# Patient Record
Sex: Male | Born: 1960 | Race: White | Hispanic: No | Marital: Married | Smoking: Never smoker
Health system: Southern US, Community
[De-identification: ages and names within clinical notes are randomized; demographics above are authoritative.]

## PROBLEM LIST (undated history)

## (undated) DIAGNOSIS — E119 Type 2 diabetes mellitus without complications: Secondary | ICD-10-CM

## (undated) HISTORY — PX: APPENDECTOMY: SHX54

---

## 2018-06-02 ENCOUNTER — Emergency Department
Admission: EM | Admit: 2018-06-02 | Discharge: 2018-06-02 | Disposition: A | Discharge status: Home / Self Care | Payer: 59 | Attending: Emergency Medicine | Admitting: Emergency Medicine

## 2018-06-02 ENCOUNTER — Other Ambulatory Visit: Payer: Self-pay

## 2018-06-02 ENCOUNTER — Emergency Department: Payer: 59

## 2018-06-02 ENCOUNTER — Encounter: Payer: Self-pay | Admitting: Emergency Medicine

## 2018-06-02 DIAGNOSIS — J129 Viral pneumonia, unspecified: Secondary | ICD-10-CM

## 2018-06-02 DIAGNOSIS — J1289 Other viral pneumonia: Secondary | ICD-10-CM | POA: Insufficient documentation

## 2018-06-02 DIAGNOSIS — E119 Type 2 diabetes mellitus without complications: Secondary | ICD-10-CM | POA: Insufficient documentation

## 2018-06-02 DIAGNOSIS — R0602 Shortness of breath: Secondary | ICD-10-CM | POA: Diagnosis present

## 2018-06-02 HISTORY — DX: Type 2 diabetes mellitus without complications: E11.9

## 2018-06-02 LAB — CBC WITH DIFFERENTIAL/PLATELET
Abs Immature Granulocytes: 0 10*3/uL (ref 0.00–0.07)
Basophils Absolute: 0 10*3/uL (ref 0.0–0.1)
Basophils Relative: 0 %
Eosinophils Absolute: 0 10*3/uL (ref 0.0–0.5)
Eosinophils Relative: 0 %
HCT: 43.6 % (ref 39.0–52.0)
Hemoglobin: 15.1 g/dL (ref 13.0–17.0)
Immature Granulocytes: 0 %
Lymphocytes Relative: 25 %
Lymphs Abs: 1.3 10*3/uL (ref 0.7–4.0)
MCH: 31.7 pg (ref 26.0–34.0)
MCHC: 34.6 g/dL (ref 30.0–36.0)
MCV: 91.4 fL (ref 80.0–100.0)
Monocytes Absolute: 0.6 10*3/uL (ref 0.1–1.0)
Monocytes Relative: 11 %
Neutro Abs: 3.3 10*3/uL (ref 1.7–7.7)
Neutrophils Relative %: 64 %
Platelets: 117 10*3/uL — ABNORMAL LOW (ref 150–400)
RBC: 4.77 MIL/uL (ref 4.22–5.81)
RDW: 12.2 % (ref 11.5–15.5)
WBC: 5.3 10*3/uL (ref 4.0–10.5)
nRBC: 0 % (ref 0.0–0.2)

## 2018-06-02 LAB — BASIC METABOLIC PANEL
Anion gap: 13 (ref 5–15)
BUN: 14 mg/dL (ref 6–20)
CO2: 22 mmol/L (ref 22–32)
Calcium: 8.5 mg/dL — ABNORMAL LOW (ref 8.9–10.3)
Chloride: 98 mmol/L (ref 98–111)
Creatinine, Ser: 0.76 mg/dL (ref 0.61–1.24)
GFR calc Af Amer: >60 mL/min (ref >60)
GFR calc non Af Amer: >60 mL/min (ref >60)
Glucose, Bld: 138 mg/dL — ABNORMAL HIGH (ref 70–99)
Potassium: 3.7 mmol/L (ref 3.5–5.1)
Sodium: 133 mmol/L — ABNORMAL LOW (ref 135–145)

## 2018-06-02 LAB — LACTIC ACID, PLASMA: Lactic Acid, Venous: 1.4 mmol/L (ref 0.5–1.9)

## 2018-06-02 MED ORDER — SODIUM CHLORIDE 0.9 % IV BOLUS
1000.0000 mL | Freq: Once | INTRAVENOUS | Status: AC
  Administered 2018-06-02: 18:00:00 1000 mL via INTRAVENOUS

## 2018-06-02 MED ORDER — KETOROLAC TROMETHAMINE 30 MG/ML IJ SOLN
30.0000 mg | Freq: Once | INTRAMUSCULAR | Status: AC
  Administered 2018-06-02: 30 mg via INTRAVENOUS
  Filled 2018-06-02: qty 1

## 2018-06-02 MED ORDER — ACETAMINOPHEN 500 MG PO TABS
1000.0000 mg | ORAL_TABLET | Freq: Once | ORAL | Status: AC
  Administered 2018-06-02: 18:00:00 1000 mg via ORAL

## 2018-06-02 MED ORDER — ALBUTEROL SULFATE HFA 108 (90 BASE) MCG/ACT IN AERS: 2.0000 | INHALATION_SPRAY | RESPIRATORY_TRACT | 0 refills | Status: AC | PRN

## 2018-06-02 MED ORDER — ACETAMINOPHEN 500 MG PO TABS
ORAL_TABLET | ORAL | Status: AC
  Filled 2018-06-02: qty 2

## 2018-06-02 NOTE — ED Provider Notes (Signed)
[facility] Emergency Department Provider Note ____________________________________________   First MD Initiated Contact with Patient 06/02/18 1710     (approximate)  I have reviewed the triage vital signs and the nursing notes.   HISTORY  Chief Complaint Fever and Cough    HPI Adrian Mclean is a 58 y.o. male with PMH as noted below who presents with worsening shortness of breath over the last 6 days, gradual onset, associated with fever, body aches, generalized malaise, as well as with a headache.  In addition the patient reports some diarrhea and decreased appetite.  He denies any travel outside the state or any specific sick contacts but states that in his job he was in contact with multiple people from different states in the last few weeks.  He was seen at the outpatient clinic 4 days ago and had a negative flu test.  He was started on azithromycin, cefdinir, and Tamiflu but his symptoms have worsened.  Past Medical History:  Diagnosis Date  . Diabetes mellitus without complication (HCC)     There are no active problems to display for this patient.   Past Surgical History:  Procedure Laterality Date  . APPENDECTOMY      Prior to Admission medications   Medication Sig Start Date End Date Taking? Authorizing Provider  albuterol (PROVENTIL HFA;VENTOLIN HFA) 108 (90 Base) MCG/ACT inhaler Inhale 2 puffs into the lungs every 4 (four) hours as needed for shortness of breath. 06/02/18   Dionne Bucy, MD    Allergies Codeine  No family history on file.  Social History Social History   Tobacco Use  . Smoking status: Never Smoker  . Smokeless tobacco: Never Used  Substance Use Topics  . Alcohol use: Never    Frequency: Never  . Drug use: Never    Review of Systems  Constitutional: Positive for fever. Eyes: No redness. ENT: No nasal congestion. Cardiovascular: Denies chest pain. Respiratory: Positive for shortness of breath.  Gastrointestinal: No vomiting.  Positive for diarrhea.  Genitourinary: Negative for flank pain.  Musculoskeletal: Positive for myalgias. Skin: Negative for rash. Neurological: Positive for headache.   ____________________________________________   PHYSICAL EXAM:  VITAL SIGNS: ED Triage Vitals  Enc Vitals Group     BP 06/02/18 1648 (!) 143/67     Pulse Rate 06/02/18 1648 89     Resp 06/02/18 1648 16     Temp 06/02/18 1648 100.2 F (37.9 C)     Temp Source 06/02/18 1648 Oral     SpO2 06/02/18 1648 96 %     Weight 06/02/18 1651 220 lb (99.8 kg)     Height 06/02/18 1651 5\' 9"  (1.753 m)     Head Circumference --      Peak Flow --      Pain Score 06/02/18 1650 8     Pain Loc --      Pain Edu? --      Excl. in GC? --     Constitutional: Alert and oriented.  Slightly tired appearing but in no acute distress. Eyes: Conjunctivae are normal.  Head: Atraumatic. Nose: No congestion/rhinnorhea. Mouth/Throat: No stridor. Neck: Normal range of motion.  Cardiovascular: Normal rate, regular rhythm.  Good peripheral circulation. Respiratory: Normal respiratory effort.  No retractions.  Gastrointestinal: No distention.  Musculoskeletal: Extremities warm and well perfused.  Neurologic:  Normal speech and language. No gross focal neurologic deficits are appreciated.  Skin:  Skin is warm and dry. No rash noted. Psychiatric: Mood and affect are normal.  Speech and behavior are normal.  ____________________________________________   LABS (all labs ordered are listed, but only abnormal results are displayed)  Labs Reviewed  BASIC METABOLIC PANEL - Abnormal; Notable for the following components:      Result Value   Sodium 133 (*)    Glucose, Bld 138 (*)    Calcium 8.5 (*)    All other components within normal limits  CBC WITH DIFFERENTIAL/PLATELET - Abnormal; Notable for the following components:   Platelets 117 (*)    All other components within normal limits  NOVEL CORONAVIRUS,  NAA (HOSPITAL ORDER, SEND-OUT TO REF LAB)  LACTIC ACID, PLASMA   ____________________________________________  EKG   ____________________________________________  RADIOLOGY  CXR: Patchy bilateral hazy opacities  ____________________________________________   PROCEDURES  Procedure(s) performed: No  Procedures  Critical Care performed: No ____________________________________________   INITIAL IMPRESSION / ASSESSMENT AND PLAN / ED COURSE  Pertinent labs & imaging results that were available during my care of the patient were reviewed by me and considered in my medical decision making (see chart for details).  58 year old male with history of diabetes presents with worsening shortness of breath, fever, malaise, headache and body aches over the last 6 days.  I reviewed the past medical records in care everywhere; he was seen at the outpatient clinic on 05/29/2018 and had a negative flu swab.  Chest x-ray subsequently showed possible right-sided "fullness" although on that date the patient was started empirically on azithromycin, cefdinir, and Tamiflu.  On exam today the patient is somewhat tired but overall well-appearing.  He is febrile with otherwise normal vital signs.  O2 saturation is in the mid 90s on room air and he has no significant increased work of breathing or respiratory distress.  Overall I have a high suspicion for COVID-19, especially given that the patient has already been on antibiotics for several days and has worsened.  Differential would also include refractory bacterial pneumonia, or other viral etiology.  Although the patient will not necessarily require admission, given the nature of his symptoms, the otherwise negative work-up, and the high risk for COVID-19 I will order the test.  We will obtain a chest x-ray, lab work-up, and reassess.  I will give fluids as well as Toradol for symptomatic treatment.  ----------------------------------------- 7:49 PM on  06/02/2018 -----------------------------------------  The patient is feeling much better and states his headache has resolved.  Chest x-ray shows faint hazy bilateral opacities.  The lab work-up is reassuring and the lactate is negative.    At this time the patient's O2 saturation remains in the mid 90s on room air and he has no increased work of breathing.  He does not require hospitalization at this time, and he feels comfortable going home.  I had an extensive discussion with the patient about the results of his work-up and the plan of care.  The hazy opacities on his chest x-ray, as well as the reassuring lab results are more consistent with a viral pneumonia or bronchitis rather than a bacterial pneumonia that is failing outpatient treatment.  I instructed him to continue the cefdinir and finish the full course.  He is already done with the azithromycin.  At this time, I do not think there is any benefit for him to continue the Tamiflu and it may be contributing to his GI symptoms and malaise.  I will give a prescription for albuterol to help with his bronchospasm.  I had a thorough discussion with him about return precautions as well as  self isolation guidelines, and he expressed understanding.  CDC PUI form was completed.   ______  Oneida Alar was evaluated in Emergency Department on 06/02/2018 for the symptoms described in the history of present illness. He was evaluated in the context of the global COVID-19 pandemic, which necessitated consideration that the patient might be at risk for infection with the SARS-CoV-2 virus that causes COVID-19. Institutional protocols and algorithms that pertain to the evaluation of patients at risk for COVID-19 are in a state of rapid change based on information released by regulatory bodies including the CDC and federal and state organizations. These policies and algorithms were followed during the patient's care in the ED.   ____________________________________________   FINAL CLINICAL IMPRESSION(S) / ED DIAGNOSES  Final diagnoses:  Viral pneumonia      NEW MEDICATIONS STARTED DURING THIS VISIT:  New Prescriptions   ALBUTEROL (PROVENTIL HFA;VENTOLIN HFA) 108 (90 BASE) MCG/ACT INHALER    Inhale 2 puffs into the lungs every 4 (four) hours as needed for shortness of breath.     Note:  This document was prepared using Dragon voice recognition software and may include unintentional dictation errors.    Dionne Bucy, MD 06/02/18 [phone]

## 2018-06-02 NOTE — Discharge Instructions (Addendum)
Take the albuterol as needed for shortness of breath or chest tightness every 4-6 hours.  You can take Tylenol or ibuprofen for fever, headache, or body aches.  Make sure to stay well-hydrated.  Continue the cefdinir and finish the full course.  You can discontinue the Tamiflu.  Self-isolation guidelines are provided below, as you are currently pending testing for possible COVID-19.  Return to the ER immediately for new or worsening shortness of breath, shortness of breath with minimal activity or exertion, worsening weakness, vomiting or inability to hold anything down, or any other new or worsening symptoms that concern you.      Person Under Monitoring Name: Adrian Mclean  Location: [address] [place] [address]   Infection Prevention Recommendations for Individuals Confirmed to have, or Being Evaluated for, 2019 Novel Coronavirus (COVID-19) Infection Who Receive Care at Home  Individuals who are confirmed to have, or are being evaluated for, COVID-19 should follow the prevention steps below until a healthcare provider or local or state health department says they can return to normal activities.  Stay home except to get medical care You should restrict activities outside your home, except for getting medical care. Do not go to work, school, or public areas, and do not use public transportation or taxis.  Call ahead before visiting your doctor Before your medical appointment, call the healthcare provider and tell them that you have, or are being evaluated for, COVID-19 infection. This will help the healthcare providers office take steps to keep other people from getting infected. Ask your healthcare provider to call the local or state health department.  Monitor your symptoms Seek prompt medical attention if your illness is worsening (e.g., difficulty breathing). Before going to your medical appointment, call the healthcare provider and tell them that you have, or are being  evaluated for, COVID-19 infection. Ask your healthcare provider to call the local or state health department.  Wear a facemask You should wear a facemask that covers your nose and mouth when you are in the same room with other people and when you visit a healthcare provider. People who live with or visit you should also wear a facemask while they are in the same room with you.  Separate yourself from other people in your home As much as possible, you should stay in a different room from other people in your home. Also, you should use a separate bathroom, if available.  Avoid sharing household items You should not share dishes, drinking glasses, cups, eating utensils, towels, bedding, or other items with other people in your home. After using these items, you should wash them thoroughly with soap and water.  Cover your coughs and sneezes Cover your mouth and nose with a tissue when you cough or sneeze, or you can cough or sneeze into your sleeve. Throw used tissues in a lined trash can, and immediately wash your hands with soap and water for at least 20 seconds or use an alcohol-based hand rub.  Wash your [employer] your hands often and thoroughly with soap and water for at least 20 seconds. You can use an alcohol-based hand sanitizer if soap and water are not available and if your hands are not visibly dirty. Avoid touching your eyes, nose, and mouth with unwashed hands.   Prevention Steps for Caregivers and Household Members of Individuals Confirmed to have, or Being Evaluated for, COVID-19 Infection Being Cared for in the Home  If you live with, or provide care at home for, a  person confirmed to have, or being evaluated for, COVID-19 infection please follow these guidelines to prevent infection:  Follow healthcare providers instructions Make sure that you understand and can help the patient follow any healthcare provider instructions for all care.  Provide for the patients  basic needs You should help the patient with basic needs in the home and provide support for getting groceries, prescriptions, and other personal needs.  Monitor the patients symptoms If they are getting sicker, call his or her medical provider and tell them that the patient has, or is being evaluated for, COVID-19 infection. This will help the healthcare providers office take steps to keep other people from getting infected. Ask the healthcare provider to call the local or state health department.  Limit the number of people who have contact with the patient If possible, have only one caregiver for the patient. Other household members should stay in another home or place of residence. If this is not possible, they should stay in another room, or be separated from the patient as much as possible. Use a separate bathroom, if available. Restrict visitors who do not have an essential need to be in the home.  Keep older adults, very young children, and other sick people away from the patient Keep older adults, very young children, and those who have compromised immune systems or chronic health conditions away from the patient. This includes people with chronic heart, lung, or kidney conditions, diabetes, and cancer.  Ensure good ventilation Make sure that shared spaces in the home have good air flow, such as from an air conditioner or an opened window, weather permitting.  Wash your hands often Wash your hands often and thoroughly with soap and water for at least 20 seconds. You can use an alcohol based hand sanitizer if soap and water are not available and if your hands are not visibly dirty. Avoid touching your eyes, nose, and mouth with unwashed hands. Use disposable paper towels to dry your hands. If not available, use dedicated cloth towels and replace them when they become wet.  Wear a facemask and gloves Wear a disposable facemask at all times in the room and gloves when you touch or  have contact with the patients blood, body fluids, and/or secretions or excretions, such as sweat, saliva, sputum, nasal mucus, vomit, urine, or feces.  Ensure the mask fits over your nose and mouth tightly, and do not touch it during use. Throw out disposable facemasks and gloves after using them. Do not reuse. Wash your hands immediately after removing your facemask and gloves. If your personal clothing becomes contaminated, carefully remove clothing and launder. Wash your hands after handling contaminated clothing. Place all used disposable facemasks, gloves, and other waste in a lined container before disposing them with other household waste. Remove gloves and wash your hands immediately after handling these items.  Do not share dishes, glasses, or other household items with the patient Avoid sharing household items. You should not share dishes, drinking glasses, cups, eating utensils, towels, bedding, or other items with a patient who is confirmed to have, or being evaluated for, COVID-19 infection. After the person uses these items, you should wash them thoroughly with soap and water.  Wash laundry thoroughly Immediately remove and wash clothes or bedding that have blood, body fluids, and/or secretions or excretions, such as sweat, saliva, sputum, nasal mucus, vomit, urine, or feces, on them. Wear gloves when handling laundry from the patient. Read and follow directions on labels of laundry or  clothing items and detergent. In general, wash and dry with the warmest temperatures recommended on the label.  Clean all areas the individual has used often Clean all touchable surfaces, such as counters, tabletops, doorknobs, bathroom fixtures, toilets, phones, keyboards, tablets, and bedside tables, every day. Also, clean any surfaces that may have blood, body fluids, and/or secretions or excretions on them. Wear gloves when cleaning surfaces the patient has come in contact with. Use a diluted  bleach solution (e.g., dilute bleach with 1 part bleach and 10 parts water) or a household disinfectant with a label that says EPA-registered for coronaviruses. To make a bleach solution at home, add 1 tablespoon of bleach to 1 quart (4 cups) of water. For a larger supply, add  cup of bleach to 1 gallon (16 cups) of water. Read labels of cleaning products and follow recommendations provided on product labels. Labels contain instructions for safe and effective use of the cleaning product including precautions you should take when applying the product, such as wearing gloves or eye protection and making sure you have good ventilation during use of the product. Remove gloves and wash hands immediately after cleaning.  Monitor yourself for signs and symptoms of illness Caregivers and household members are considered close contacts, should monitor their health, and will be asked to limit movement outside of the home to the extent possible. Follow the monitoring steps for close contacts listed on the symptom monitoring form.   ? If you have additional questions, contact your local health department or call the epidemiologist on call at [phone] (available 24/7). ? This guidance is subject to change. For the most up-to-date guidance from [facility], please refer to their website: [email or web address]

## 2018-06-02 NOTE — ED Triage Notes (Signed)
Patient states he started having body aches last Saturday evening along with shortness of breath.  Patient states he was seen at [facility] on Monday and was tested for Flu (negative), and had a chest x-ray (right lobe showing cloudy).  Patient was put on tamiflu, cefdinir and azithromycin.  Patient states he has had a constant headache and he has burning with taking a deep breath.  Patient states he feels like he can't breathe if he talks a lot.  Patient speaking in full sentences at this time.  Patient ambulatory to tent.  Patient reports burning sensation in chest with coughing.

## 2018-06-03 ENCOUNTER — Telehealth: Payer: Self-pay | Admitting: Internal Medicine

## 2018-06-03 NOTE — Telephone Encounter (Signed)
Left VM. Will call him back in the am to review results and public health recommendations

## 2018-06-05 LAB — NOVEL CORONAVIRUS, NAA (HOSP ORDER, SEND-OUT TO REF LAB; TAT 18-24 HRS): SARS-CoV-2, NAA: DETECTED — AB

## 2018-06-06 ENCOUNTER — Encounter: Payer: Self-pay | Admitting: Emergency Medicine

## 2018-06-06 ENCOUNTER — Emergency Department: Payer: 59

## 2018-06-06 ENCOUNTER — Other Ambulatory Visit: Payer: Self-pay

## 2018-06-06 ENCOUNTER — Emergency Department
Admission: EM | Admit: 2018-06-06 | Discharge: 2018-06-07 | Disposition: A | Discharge status: Home / Self Care | Payer: 59 | Attending: Internal Medicine | Admitting: Internal Medicine

## 2018-06-06 DIAGNOSIS — E119 Type 2 diabetes mellitus without complications: Secondary | ICD-10-CM | POA: Diagnosis not present

## 2018-06-06 DIAGNOSIS — R6889 Other general symptoms and signs: Secondary | ICD-10-CM | POA: Diagnosis not present

## 2018-06-06 DIAGNOSIS — J069 Acute upper respiratory infection, unspecified: Secondary | ICD-10-CM | POA: Diagnosis present

## 2018-06-06 DIAGNOSIS — R0602 Shortness of breath: Secondary | ICD-10-CM | POA: Diagnosis present

## 2018-06-06 DIAGNOSIS — R0902 Hypoxemia: Secondary | ICD-10-CM | POA: Diagnosis not present

## 2018-06-06 DIAGNOSIS — J988 Other specified respiratory disorders: Secondary | ICD-10-CM

## 2018-06-06 LAB — PROCALCITONIN: Procalcitonin: <0.1 ng/mL

## 2018-06-06 LAB — CBC WITH DIFFERENTIAL/PLATELET
Abs Immature Granulocytes: 0.02 10*3/uL (ref 0.00–0.07)
Basophils Absolute: 0 10*3/uL (ref 0.0–0.1)
Basophils Relative: 0 %
Eosinophils Absolute: 0 10*3/uL (ref 0.0–0.5)
Eosinophils Relative: 0 %
HCT: 42.4 % (ref 39.0–52.0)
Hemoglobin: 14.6 g/dL (ref 13.0–17.0)
Immature Granulocytes: 0 %
Lymphocytes Relative: 11 %
Lymphs Abs: 0.7 10*3/uL (ref 0.7–4.0)
MCH: 31.5 pg (ref 26.0–34.0)
MCHC: 34.4 g/dL (ref 30.0–36.0)
MCV: 91.4 fL (ref 80.0–100.0)
Monocytes Absolute: 0.4 10*3/uL (ref 0.1–1.0)
Monocytes Relative: 6 %
Neutro Abs: 5 10*3/uL (ref 1.7–7.7)
Neutrophils Relative %: 83 %
Platelets: 144 10*3/uL — ABNORMAL LOW (ref 150–400)
RBC: 4.64 MIL/uL (ref 4.22–5.81)
RDW: 12.4 % (ref 11.5–15.5)
WBC: 6.1 10*3/uL (ref 4.0–10.5)
nRBC: 0 % (ref 0.0–0.2)

## 2018-06-06 LAB — TROPONIN I: Troponin I: <0.03 ng/mL (ref <0.03)

## 2018-06-06 LAB — COMPREHENSIVE METABOLIC PANEL
ALT: 32 U/L (ref 0–44)
AST: 46 U/L — ABNORMAL HIGH (ref 15–41)
Albumin: 4.1 g/dL (ref 3.5–5.0)
Alkaline Phosphatase: 57 U/L (ref 38–126)
Anion gap: 15 (ref 5–15)
BUN: 16 mg/dL (ref 6–20)
CO2: 21 mmol/L — ABNORMAL LOW (ref 22–32)
Calcium: 8.4 mg/dL — ABNORMAL LOW (ref 8.9–10.3)
Chloride: 99 mmol/L (ref 98–111)
Creatinine, Ser: 0.75 mg/dL (ref 0.61–1.24)
GFR calc Af Amer: >60 mL/min (ref >60)
GFR calc non Af Amer: >60 mL/min (ref >60)
Glucose, Bld: 139 mg/dL — ABNORMAL HIGH (ref 70–99)
Potassium: 3.7 mmol/L (ref 3.5–5.1)
Sodium: 135 mmol/L (ref 135–145)
Total Bilirubin: 1.4 mg/dL — ABNORMAL HIGH (ref 0.3–1.2)
Total Protein: 7.8 g/dL (ref 6.5–8.1)

## 2018-06-06 LAB — BLOOD GAS, VENOUS
Acid-base deficit: 1.1 mmol/L (ref 0.0–2.0)
Bicarbonate: 22.6 mmol/L (ref 20.0–28.0)
O2 Saturation: 86.8 %
Patient temperature: 37
pCO2, Ven: 34 mmHg — ABNORMAL LOW (ref 44.0–60.0)
pH, Ven: 7.43 (ref 7.250–7.430)
pO2, Ven: 51 mmHg — ABNORMAL HIGH (ref 32.0–45.0)

## 2018-06-06 LAB — FERRITIN: Ferritin: 894 ng/mL — ABNORMAL HIGH (ref 24–336)

## 2018-06-06 LAB — GLUCOSE, CAPILLARY: Glucose-Capillary: 131 mg/dL — ABNORMAL HIGH (ref 70–99)

## 2018-06-06 LAB — CK: Total CK: 110 U/L (ref 49–397)

## 2018-06-06 LAB — FIBRIN DERIVATIVES D-DIMER (ARMC ONLY): Fibrin derivatives D-dimer (ARMC): 1985.26 ng/mL (FEU) — ABNORMAL HIGH (ref 0.00–499.00)

## 2018-06-06 LAB — LACTIC ACID, PLASMA: Lactic Acid, Venous: 1.1 mmol/L (ref 0.5–1.9)

## 2018-06-06 LAB — BRAIN NATRIURETIC PEPTIDE: B Natriuretic Peptide: 20 pg/mL (ref 0.0–100.0)

## 2018-06-06 LAB — LACTATE DEHYDROGENASE: LDH: 308 U/L — ABNORMAL HIGH (ref 98–192)

## 2018-06-06 MED ORDER — ACETAMINOPHEN 500 MG PO TABS
ORAL_TABLET | ORAL | Status: AC
  Filled 2018-06-06: qty 2

## 2018-06-06 MED ORDER — SODIUM CHLORIDE 0.9 % IV SOLN
2.0000 g | Freq: Once | INTRAVENOUS | Status: AC
  Administered 2018-06-06: 2 g via INTRAVENOUS
  Filled 2018-06-06: qty 20

## 2018-06-06 MED ORDER — SODIUM CHLORIDE 0.9 % IV BOLUS
500.0000 mL | Freq: Once | INTRAVENOUS | Status: AC
  Administered 2018-06-06: 500 mL via INTRAVENOUS

## 2018-06-06 MED ORDER — HYDROXYCHLOROQUINE SULFATE 200 MG PO TABS
400.0000 mg | ORAL_TABLET | Freq: Two times a day (BID) | ORAL | Status: DC
  Administered 2018-06-06: 400 mg via ORAL
  Filled 2018-06-06: qty 2

## 2018-06-06 MED ORDER — SODIUM CHLORIDE 0.9 % IV SOLN
500.0000 mg | Freq: Once | INTRAVENOUS | Status: AC
  Administered 2018-06-06: 500 mg via INTRAVENOUS
  Filled 2018-06-06 (×2): qty 500

## 2018-06-06 MED ORDER — ACETAMINOPHEN 500 MG PO TABS
1000.0000 mg | ORAL_TABLET | Freq: Once | ORAL | Status: AC
  Administered 2018-06-06: 1000 mg via ORAL

## 2018-06-06 MED ORDER — HYDROXYCHLOROQUINE SULFATE 200 MG PO TABS: 200.0000 mg | ORAL_TABLET | Freq: Two times a day (BID) | ORAL | Status: DC

## 2018-06-06 NOTE — ED Notes (Signed)
[facility] to take patient to [facility]. Doctor from [place] called during patient to be moved. Per doctor having to hold patient until bed to be determined due to patient needing increase O2. Patient currently on 4L with stats at 93%-95%

## 2018-06-06 NOTE — ED Provider Notes (Addendum)
[facility] Emergency Department Provider Note  ____________________________________________  Time seen: Approximately 6:02 PM  I have reviewed the triage vital signs and the nursing notes.   HISTORY  Chief Complaint Shortness of Breath and COVID    HPI Adrian Mclean is a 58 y.o. male with known + COVID test 4/3 presenting with worsening sob.  The pt was seen here 4/3 after over a week with cough/fever/sob/diarrhea, having taken Tamiflu and Zpak, and Cefdinir w/o improvement.  He was stable for discharge home and his coronavirus testing did come back positive.  He has been at home with contact only with his wife, who wears a mask when she takes care of him and has not been ill herself.  He reports that over the past day or so, he has been feeling more short of breath.  The only time he is not short of breath if he is completely still.  If he ambulates at all he becomes short of breath.  He continues to have diarrhea and reports 3 episodes today.  He has not had much of an appetite but does continue to drink plenty of fluid.  He has not had any abdominal pain.  He continues to have fevers as high as 101 or 102, which do respond to Tylenol.  Overall, the patient has no new symptoms, but continues to have cough and diarrhea, and does feel that his shortness of breath and exercise tolerance are worsening.   Past Medical History:  Diagnosis Date  . Diabetes mellitus without complication (HCC)     There are no active problems to display for this patient.   Past Surgical History:  Procedure Laterality Date  . APPENDECTOMY      Current Outpatient Rx  . Order #: [id number] Class: Normal    Allergies Codeine  History reviewed. No pertinent family history.  Social History Social History   Tobacco Use  . Smoking status: Never Smoker  . Smokeless tobacco: Never Used  Substance Use Topics  . Alcohol use: Never    Frequency: Never  . Drug use: Never    Review  of Systems Constitutional: No fever/chills.  Lightheadedness or syncope.  Positive general malaise.  Decreased exercise tolerance. Eyes: No visual changes.  No eye discharge. ENT: No sore throat. No congestion or rhinorrhea. Cardiovascular: Denies chest pain. Denies palpitations. Respiratory: Positive worsening shortness of breath.  Positive ongoing cough. Gastrointestinal: No abdominal pain.  No nausea, no vomiting.  Positive diarrhea.  No constipation.  Decreased appetite. Genitourinary: Negative for dysuria. Musculoskeletal: Negative for back pain. Skin: Negative for rash. Neurological: Negative for headaches. No focal numbness, tingling or weakness.     ____________________________________________   PHYSICAL EXAM:  VITAL SIGNS: ED Triage Vitals  Enc Vitals Group     BP 06/06/18 1744 114/69     Pulse --      Resp 06/06/18 1744 20     Temp 06/06/18 1744 99.9 F (37.7 C)     Temp Source 06/06/18 1744 Oral     SpO2 06/06/18 1744 96 %     Weight 06/06/18 1740 220 lb (99.8 kg)     Height 06/06/18 1740  (1.753 m)     Head Circumference --      Peak Flow --      Pain Score 06/06/18 1740 7     Pain Loc --      Pain Edu? --      Excl. in GC? --     Constitutional: Alert  and oriented. Answers questions appropriately.  Looks like he does not feel well. Eyes: Conjunctivae are normal.  EOMI. No scleral icterus.  No eye discharge. Head: Atraumatic. Nose: Mild congestion. Neck: No stridor.  Supple.  No JVD.  No meningismus. Cardiovascular: Normal rate, regular rhythm. No murmurs, rubs or gallops.  Respiratory: Normal respiratory effort.  No accessory muscle use or retractions.  Oxygen saturation is 96% on room air at rest. Gastrointestinal: Overweight. Musculoskeletal: MAEW. Neurologic:  A&Ox3.  Speech is clear.  Face and smile are symmetric.  EOMI.  Moves all extremities well. Skin:  Skin is warm, dry and intact. No rash noted. Psychiatric: Mood and affect are normal.    ____________________________________________   LABS (all labs ordered are listed, but only abnormal results are displayed)  Labs Reviewed  CULTURE, BLOOD (ROUTINE X 2)  CULTURE, BLOOD (ROUTINE X 2)  CBC WITH DIFFERENTIAL/PLATELET  BLOOD GAS, VENOUS  COMPREHENSIVE METABOLIC PANEL  TROPONIN I  BRAIN NATRIURETIC PEPTIDE  LACTIC ACID, PLASMA  LACTIC ACID, PLASMA  PROCALCITONIN  FERRITIN  LACTATE DEHYDROGENASE  CK  FIBRIN DERIVATIVES D-DIMER ([facility] ONLY)  CBG MONITORING, ED   ____________________________________________  EKG  ED ECG REPORT I, Anne-Caroline Sharma Covert, the attending physician, personally viewed and interpreted this ECG.   Date: 06/06/2018  EKG Time: 1840  Rate: 93  Rhythm: normal sinus rhythm  Axis: normal  Intervals:none; note that there is no QTc prolongation  ST&T Change: No STEMI  ____________________________________________  RADIOLOGY  No results found.  ____________________________________________   PROCEDURES  Procedure(s) performed: None  Procedures  Critical Care performed: Yes, see critical care note(s) ____________________________________________   INITIAL IMPRESSION / ASSESSMENT AND PLAN / ED COURSE  Pertinent labs & imaging results that were available during my care of the patient were reviewed by me and considered in my medical decision making (see chart for details).  58 y.o. male who is known to be positive for corona virus presenting with worsening shortness of breath and decreased exercise tolerance, as well as ongoing coughing and diarrhea.  Overall, the patient is hemodynamically stable and has reassuring vital signs.  However, we will ambulate him to see how his oxygen saturation does with exertion.  We will also get a chest x-ray to evaluate how his lungs are doing.  I have considered PE given his increased shortness of breath and some reports of hypercoagulability in patients with coronavirus; I will discuss this with the  infectious disease physician.  Basic laboratory studies are also pending.  The patient will be treated with a 500 cc bolus of fluid; reports are that these patients do better if they run a little bit dry but the patient may be on the dehydrated side and some fluid may help him feel better.  Plan reevaluation for final disposition.  ----------------------------------------- 6:38 PM on 06/06/2018 -----------------------------------------  The nurse went to do an ambulatory pulse oximetry reading and the patient was sitting on the side of the bed with a good pleth showing 84% on room air.  Now on 2 L nasal cannula with O2 sats of between 94-96%.  Spoken with infectious disease doctor who recommends the initiation of hydroxychloroquine and I am awaiting the patient's EKG.  In addition, we will order multiple inflammatory markers including procalcitonin, LDH, ferritin, CPK and d-dimer.  The patient will be admitted, and per protocol will be transferred to [facility].  ----------------------------------------- 7:10 PM on 06/06/2018 -----------------------------------------  The patient's chest x-ray shows worsening disease compared to prior chest x-ray.  -----------------------------------------  7:28 PM on 06/06/2018 -----------------------------------------  I have called [facility] and Dr. Natale MilchLancaster has accepted the patient for transfer per protocol to [facility].  While in all likelihood, the patient's chest x-ray abnormalities are due to coronavirus alone, given his comorbidity of diabetes, the accepting physician at [facility] and I discussed the risks and benefits of antibacterial coverage.  At this time, will initiate antibiotics based on his clinical course, they will be continued or discontinued at [facility].  The patient is up-to-date with the plan, and continues to maintain oxygen saturations in the 90's on 2L [place].  CRITICAL CARE Performed by: Rockne MenghiniAnne-Caroline  Kaydra Borgen   Total critical care time: 45 minutes  Critical care time was exclusive of separately billable procedures and treating other patients.  Critical care was necessary to treat or prevent imminent or life-threatening deterioration.  Critical care was time spent personally by me on the following activities: development of treatment plan with patient and/or surrogate as well as nursing, discussions with consultants, evaluation of patient's response to treatment, examination of patient, obtaining history from patient or surrogate, ordering and performing treatments and interventions, ordering and review of laboratory studies, ordering and review of radiographic studies, pulse oximetry and re-evaluation of patient's condition.  Adrian Mclean was evaluated in Emergency Department on 06/06/2018 for the symptoms described in the history of present illness. He was evaluated in the context of the global COVID-19 pandemic, which necessitated consideration that the patient might be at risk for infection with the SARS-CoV-2 virus that causes COVID-19. Institutional protocols and algorithms that pertain to the evaluation of patients at risk for COVID-19 are in a state of rapid change based on information released by regulatory bodies including the CDC and federal and state organizations. These policies and algorithms were followed during the patient's care in the ED.    ____________________________________________  FINAL CLINICAL IMPRESSION(S) / ED DIAGNOSES  Final diagnoses:  COVID-19  Hypoxia  Decreased exercise tolerance         NEW MEDICATIONS STARTED DURING THIS VISIT:  New Prescriptions   No medications on file      Rockne MenghiniNorman, Anne-Caroline, MD 06/06/18 1911    Rockne MenghiniNorman, Anne-Caroline, MD 06/06/18 1929

## 2018-06-06 NOTE — ED Notes (Signed)
Report given to [facility] at [facility]

## 2018-06-06 NOTE — ED Notes (Signed)
[facility] transporting patient to [facility] to 2[facility] Bed 9.

## 2018-06-06 NOTE — Progress Notes (Signed)
Contacted by [facility] Clista Bernhardt. He stated they had noted patient's increasing oxygen requirement and was concerned he would continue to deteriorate, and there were currently no ICU beds available at [facility]. Informed charge rn.

## 2018-06-06 NOTE — ED Notes (Signed)
ED TO INPATIENT HANDOFF REPORT  ED Nurse Name and Phone #: Rosealee Albee [phone]  S Name/Age/Gender Oneida Alar 58 y.o. male Room/Bed: ED36A/ED36A  Code Status   Code Status: Not on file  Home/SNF/Other Home Patient oriented to: self, place, time and situation Is this baseline? Yes   Triage Complete: Triage complete  Chief Complaint Covid  Triage Note Here for worsening SHOB per pt.  Was seen her over weekend and dx with COVID.  Chest wall pain with cough.  Unlabored at check in desk. Pt has mask.  Has been using inhaler.  Do not anticipate need for bipap or intubation for patient based on assessment at this time.   Allergies Allergies  Allergen Reactions  . Codeine Hives and Other (See Comments)    My body does not like it    Level of Care/Admitting Diagnosis ED Disposition    ED Disposition Condition Comment   Transfer to Another Facility  The patient appears reasonably stabilized for transfer considering the current resources, flow, and capabilities available in the ED at this time, and I doubt any other [facility] requiring further screening and/or treatment in the ED prior to transfer is p resent.       B Medical/Surgery History Past Medical History:  Diagnosis Date  . Diabetes mellitus without complication [facility])    Past Surgical History:  Procedure Laterality Date  . APPENDECTOMY       A IV Location/Drains/Wounds Patient Lines/Drains/Airways Status   Active Line/Drains/Airways    Name:   Placement date:   Placement time:   Site:   Days:   Peripheral IV 06/06/18 Left Hand   06/06/18    1906    Hand   less than 1          Intake/Output Last 24 hours No intake or output data in the 24 hours ending 06/06/18 2101  Labs/Imaging Results for orders placed or performed during the hospital encounter of 06/06/18 (from the past 48 hour(s))  Blood gas, venous     Status: Abnormal   Collection Time: 06/06/18  6:03 PM  Result Value Ref Range   pH, Ven 7.43  7.250 - 7.430   pCO2, Ven 34 (L) 44.0 - 60.0 mmHg   pO2, Ven 51.0 (H) 32.0 - 45.0 mmHg   Bicarbonate 22.6 20.0 - 28.0 mmol/L   Acid-base deficit 1.1 0.0 - 2.0 mmol/L   O2 Saturation 86.8 %   Patient temperature 37.0    Collection site VEIN    Sample type VENOUS     Comment: Performed at [facility], [address] [place], [place] [address]  CBC with Differential     Status: Abnormal   Collection Time: 06/06/18  6:36 PM  Result Value Ref Range   WBC 6.1 4.0 - 10.5 K/uL   RBC 4.64 4.22 - 5.81 MIL/uL   Hemoglobin 14.6 13.0 - 17.0 g/dL   HCT 75.4 36.0 - 67.7 %   MCV 91.4 80.0 - 100.0 fL   MCH 31.5 26.0 - 34.0 pg   MCHC 34.4 30.0 - 36.0 g/dL   RDW 03.4 03.5 - 24.8 %   Platelets 144 (L) 150 - 400 K/uL   nRBC 0.0 0.0 - 0.2 %   Neutrophils Relative % 83 %   Neutro Abs 5.0 1.7 - 7.7 K/uL   Lymphocytes Relative 11 %   Lymphs Abs 0.7 0.7 - 4.0 K/uL   Monocytes Relative 6 %   Monocytes Absolute 0.4 0.1 - 1.0 K/uL  Eosinophils Relative 0 %   Eosinophils Absolute 0.0 0.0 - 0.5 K/uL   Basophils Relative 0 %   Basophils Absolute 0.0 0.0 - 0.1 K/uL   Immature Granulocytes 0 %   Abs Immature Granulocytes 0.02 0.00 - 0.07 K/uL    Comment: Performed at [facility] [address] [place], [place] [address]  Comprehensive metabolic panel     Status: Abnormal   Collection Time: 06/06/18  6:36 PM  Result Value Ref Range   Sodium 135 135 - 145 mmol/L   Potassium 3.7 3.5 - 5.1 mmol/L   Chloride 99 98 - 111 mmol/L   CO2 21 (L) 22 - 32 mmol/L   Glucose, Bld 139 (H) 70 - 99 mg/dL   BUN 16 6 - 20 mg/dL   Creatinine, Ser 1.91 0.61 - 1.24 mg/dL   Calcium 8.4 (L) 8.9 - 10.3 mg/dL   Total Protein 7.8 6.5 - 8.1 g/dL   Albumin 4.1 3.5 - 5.0 g/dL   AST 46 (H) 15 - 41 U/L   ALT 32 0 - 44 U/L   Alkaline Phosphatase 57 38 - 126 U/L   Total Bilirubin 1.4 (H) 0.3 - 1.2 mg/dL   GFR calc non Af Amer >60 >60 mL/min   GFR calc Af Amer >60 >60 mL/min   Anion gap 15 5 - 15     Comment: Performed at [facility], [address] [place], [place] [address]  Troponin I - ONCE - STAT     Status: None   Collection Time: 06/06/18  6:36 PM  Result Value Ref Range   Troponin I <0.03 <0.03 ng/mL    Comment: Performed at [facility], [address] [place], [place] [address]  Lactic acid, plasma     Status: None   Collection Time: 06/06/18  6:38 PM  Result Value Ref Range   Lactic Acid, Venous 1.1 0.5 - 1.9 mmol/L    Comment: Performed at [facility], [address] [place], [place] [address]  Procalcitonin     Status: None   Collection Time: 06/06/18  6:42 PM  Result Value Ref Range   Procalcitonin <0.10 ng/mL    Comment:        Interpretation: PCT (Procalcitonin) <= 0.5 ng/mL: Systemic infection (sepsis) is not likely. Local bacterial infection is possible. (NOTE)       Sepsis PCT Algorithm           Lower Respiratory Tract                                      Infection PCT Algorithm    ----------------------------     ----------------------------         PCT < 0.25 ng/mL                PCT < 0.10 ng/mL         Strongly encourage             Strongly discourage   discontinuation of antibiotics    initiation of antibiotics    ----------------------------     -----------------------------       PCT 0.25 - 0.50 ng/mL            PCT 0.10 - 0.25 ng/mL               OR       >80% decrease in PCT  Discourage initiation of                                            antibiotics      Encourage discontinuation           of antibiotics    ----------------------------     -----------------------------         PCT >= 0.50 ng/mL              PCT 0.26 - 0.50 ng/mL               AND        <80% decrease in PCT             Encourage initiation of                                             antibiotics       Encourage continuation           of antibiotics    ----------------------------     -----------------------------         PCT >= 0.50 ng/mL                  PCT > 0.50 ng/mL               AND         increase in PCT                  Strongly encourage                                      initiation of antibiotics    Strongly encourage escalation           of antibiotics                                     -----------------------------                                           PCT <= 0.25 ng/mL                                                 OR                                        > 80% decrease in PCT                                     Discontinue / Do not initiate  antibiotics Performed at [facility], [address] [place], [place] [address]   Fibrin derivatives D-[facility] only)     Status: Abnormal   Collection Time: 06/06/18  6:42 PM  Result Value Ref Range   Fibrin derivatives D-dimer ([facility]) 1,985.26 (H) 0.00 - 499.00 ng/mL (FEU)    Comment: (NOTE) <> Exclusion of Venous Thromboembolism (VTE) - OUTPATIENT ONLY   (Emergency Department or [place])   0-499 ng/ml (FEU): With a low to intermediate pretest probability                      for VTE this test result excludes the diagnosis                      of VTE.   >499 ng/ml (FEU) : VTE not excluded; additional work up for VTE is                      required. <> Testing on Inpatients and Evaluation of Disseminated Intravascular   Coagulation (DIC) Reference Range:   0-499 ng/ml (FEU) Performed at [facility], [address], [place], [place] [address]    Dg Chest Portable 1 View  Result Date: 06/06/2018 CLINICAL DATA:  Increasing shortness of breath. Recent diagnosis of COVID-19. EXAM: PORTABLE CHEST 1 VIEW COMPARISON:  Radiograph 06/02/2018 FINDINGS: Progressive bilateral airspace opacities in the peripheral and mid lower lung zone predominance. Lung volumes are low. Unchanged heart size and mediastinal contours. No large pleural effusion or pneumothorax. No acute osseous  abnormalities. IMPRESSION: Worsening bilateral airspace opacities over the past 4 days. Pattern consistent with diagnosis of COVID-19. Electronically Signed   By: Narda Rutherford M.D.   On: 06/06/2018 18:59    Pending Labs Unresulted Labs (From admission, onward)    Start     Ordered   06/06/18 1838  Blood culture (routine x 2)  BLOOD CULTURE X 2,   STAT     06/06/18 1837   06/06/18 1837  Ferritin (Iron Binding Protein)  ONCE - STAT,   STAT     06/06/18 1837   06/06/18 1837  Lactate dehydrogenase  ONCE - STAT,   STAT     06/06/18 1837   06/06/18 1837  CK  ONCE - STAT,   STAT     06/06/18 1837   06/06/18 1804  Brain natriuretic peptide  Once,   STAT     06/06/18 1804          Vitals/Pain Today's Vitals   06/06/18 1740 06/06/18 1744 06/06/18 2005  BP:  114/69 (!) 142/61  Pulse:   93  Resp:  20 18  Temp:  99.9 F (37.7 C) (!) 100.6 F (38.1 C)  TempSrc:  Oral Oral  SpO2:  96% 94%  Weight: 99.8 kg    Height:  (1.753 m)    PainSc: 7   5     Isolation Precautions No active isolations  Medications Medications  hydroxychloroquine (PLAQUENIL) tablet 400 mg (has no administration in time range)    Followed by  hydroxychloroquine (PLAQUENIL) tablet 200 mg (has no administration in time range)  azithromycin (ZITHROMAX) 500 mg in sodium chloride 0.9 % 250 mL IVPB (500 mg Intravenous New Bag/Given 06/06/18 2053)  sodium chloride 0.9 % bolus 500 mL (500 mLs Intravenous New Bag/Given 06/06/18 1906)  cefTRIAXone (ROCEPHIN) 2 g in sodium chloride 0.9 % 100 mL IVPB (0 g Intravenous Stopped 06/06/18 2049)  acetaminophen (  TYLENOL) tablet 1,000 mg (1,000 mg Oral Given 06/06/18 2028)    Mobility walks Low fall risk   Focused Assessments Covid 19   R Recommendations: See Admitting Provider Note  Report given to:   Additional Notes:

## 2018-06-06 NOTE — Progress Notes (Signed)
Was informed patient would be going to [facility].

## 2018-06-06 NOTE — ED Notes (Signed)
This RN was going to ambulate pt around the room to see if oxygen saturation dropped on room air. Pt was 84% RA while sitting on side of bed. Placed 2 L [place] on pt. Did NOT walk pt.

## 2018-06-06 NOTE — ED Triage Notes (Signed)
Here for worsening SHOB per pt.  Was seen her over weekend and dx with COVID.  Chest wall pain with cough.  Unlabored at check in desk. Pt has mask.  Has been using inhaler.  Do not anticipate need for bipap or intubation for patient based on assessment at this time.

## 2018-06-06 NOTE — ED Notes (Signed)
Report given to [facility]. [facility] to pick up patient within 30-45 mins

## 2018-06-06 NOTE — Progress Notes (Signed)
Spoke with [facility] Luna Kitchens. He stated he would contact [facility] to see if there was an available bed at [facility].

## 2018-06-07 ENCOUNTER — Inpatient Hospital Stay (HOSPITAL_COMMUNITY): Payer: 59

## 2018-06-07 ENCOUNTER — Inpatient Hospital Stay (HOSPITAL_COMMUNITY)
Admission: AD | Admit: 2018-06-07 | Discharge: 2018-06-18 | DRG: 208 | Disposition: A | Discharge status: Home / Self Care | Payer: 59 | Source: Other Acute Inpatient Hospital | Attending: Family Medicine | Admitting: Family Medicine

## 2018-06-07 ENCOUNTER — Encounter (HOSPITAL_COMMUNITY): Payer: Self-pay | Admitting: Family Medicine

## 2018-06-07 DIAGNOSIS — R740 Nonspecific elevation of levels of transaminase and lactic acid dehydrogenase [LDH]: Secondary | ICD-10-CM | POA: Diagnosis present

## 2018-06-07 DIAGNOSIS — L8990 Pressure ulcer of unspecified site, unspecified stage: Secondary | ICD-10-CM

## 2018-06-07 DIAGNOSIS — Z9911 Dependence on respirator [ventilator] status: Secondary | ICD-10-CM | POA: Diagnosis not present

## 2018-06-07 DIAGNOSIS — E870 Hyperosmolality and hypernatremia: Secondary | ICD-10-CM | POA: Diagnosis not present

## 2018-06-07 DIAGNOSIS — R1310 Dysphagia, unspecified: Secondary | ICD-10-CM | POA: Diagnosis not present

## 2018-06-07 DIAGNOSIS — J9690 Respiratory failure, unspecified, unspecified whether with hypoxia or hypercapnia: Secondary | ICD-10-CM

## 2018-06-07 DIAGNOSIS — R0902 Hypoxemia: Secondary | ICD-10-CM | POA: Diagnosis not present

## 2018-06-07 DIAGNOSIS — L89311 Pressure ulcer of right buttock, stage 1: Secondary | ICD-10-CM | POA: Diagnosis not present

## 2018-06-07 DIAGNOSIS — J9601 Acute respiratory failure with hypoxia: Secondary | ICD-10-CM | POA: Diagnosis present

## 2018-06-07 DIAGNOSIS — E669 Obesity, unspecified: Secondary | ICD-10-CM | POA: Diagnosis present

## 2018-06-07 DIAGNOSIS — I10 Essential (primary) hypertension: Secondary | ICD-10-CM | POA: Diagnosis present

## 2018-06-07 DIAGNOSIS — R0602 Shortness of breath: Secondary | ICD-10-CM

## 2018-06-07 DIAGNOSIS — E119 Type 2 diabetes mellitus without complications: Secondary | ICD-10-CM

## 2018-06-07 DIAGNOSIS — L89321 Pressure ulcer of left buttock, stage 1: Secondary | ICD-10-CM | POA: Diagnosis not present

## 2018-06-07 DIAGNOSIS — Z6831 Body mass index (BMI) 31.0-31.9, adult: Secondary | ICD-10-CM

## 2018-06-07 DIAGNOSIS — J069 Acute upper respiratory infection, unspecified: Secondary | ICD-10-CM | POA: Diagnosis not present

## 2018-06-07 DIAGNOSIS — J1289 Other viral pneumonia: Secondary | ICD-10-CM | POA: Diagnosis present

## 2018-06-07 DIAGNOSIS — E876 Hypokalemia: Secondary | ICD-10-CM | POA: Diagnosis not present

## 2018-06-07 DIAGNOSIS — J80 Acute respiratory distress syndrome: Secondary | ICD-10-CM | POA: Diagnosis present

## 2018-06-07 DIAGNOSIS — Z4659 Encounter for fitting and adjustment of other gastrointestinal appliance and device: Secondary | ICD-10-CM

## 2018-06-07 DIAGNOSIS — Z978 Presence of other specified devices: Secondary | ICD-10-CM

## 2018-06-07 DIAGNOSIS — E1165 Type 2 diabetes mellitus with hyperglycemia: Secondary | ICD-10-CM | POA: Diagnosis present

## 2018-06-07 DIAGNOSIS — Z885 Allergy status to narcotic agent status: Secondary | ICD-10-CM | POA: Diagnosis not present

## 2018-06-07 DIAGNOSIS — E8770 Fluid overload, unspecified: Secondary | ICD-10-CM | POA: Diagnosis not present

## 2018-06-07 LAB — GLUCOSE, CAPILLARY
Glucose-Capillary: 138 mg/dL — ABNORMAL HIGH (ref 70–99)
Glucose-Capillary: 153 mg/dL — ABNORMAL HIGH (ref 70–99)
Glucose-Capillary: 211 mg/dL — ABNORMAL HIGH (ref 70–99)
Glucose-Capillary: 227 mg/dL — ABNORMAL HIGH (ref 70–99)
Glucose-Capillary: 231 mg/dL — ABNORMAL HIGH (ref 70–99)

## 2018-06-07 LAB — POCT I-STAT 7, (LYTES, BLD GAS, ICA,H+H)
Acid-base deficit: 3 mmol/L — ABNORMAL HIGH (ref 0.0–2.0)
Bicarbonate: 23.7 mmol/L (ref 20.0–28.0)
Calcium, Ion: 1.15 mmol/L (ref 1.15–1.40)
HCT: 45 % (ref 39.0–52.0)
Hemoglobin: 15.3 g/dL (ref 13.0–17.0)
O2 Saturation: 100 %
Patient temperature: 99.6
Potassium: 3.6 mmol/L (ref 3.5–5.1)
Sodium: 135 mmol/L (ref 135–145)
TCO2: 25 mmol/L (ref 22–32)
pCO2 arterial: 48.4 mmHg — ABNORMAL HIGH (ref 32.0–48.0)
pH, Arterial: 7.301 — ABNORMAL LOW (ref 7.350–7.450)
pO2, Arterial: 234 mmHg — ABNORMAL HIGH (ref 83.0–108.0)

## 2018-06-07 LAB — HIV ANTIBODY (ROUTINE TESTING W REFLEX): HIV Screen 4th Generation wRfx: NONREACTIVE

## 2018-06-07 LAB — STREP PNEUMONIAE URINARY ANTIGEN: Strep Pneumo Urinary Antigen: NEGATIVE

## 2018-06-07 LAB — MRSA PCR SCREENING: MRSA by PCR: NEGATIVE

## 2018-06-07 MED ORDER — DOCUSATE SODIUM 50 MG/5ML PO LIQD: 100.0000 mg | Freq: Two times a day (BID) | ORAL | Status: DC | PRN

## 2018-06-07 MED ORDER — TOCILIZUMAB 400 MG/20ML IV SOLN
400.0000 mg | Freq: Once | INTRAVENOUS | Status: AC
  Administered 2018-06-07: 19:00:00 400 mg via INTRAVENOUS
  Filled 2018-06-07: qty 20

## 2018-06-07 MED ORDER — ORAL CARE MOUTH RINSE
15.0000 mL | Freq: Two times a day (BID) | OROMUCOSAL | Status: DC
  Administered 2018-06-07: 15 mL via OROMUCOSAL

## 2018-06-07 MED ORDER — ONDANSETRON HCL 4 MG PO TABS: 4.0000 mg | ORAL_TABLET | Freq: Four times a day (QID) | ORAL | Status: DC | PRN

## 2018-06-07 MED ORDER — MIDAZOLAM HCL 2 MG/2ML IJ SOLN
2.0000 mg | Freq: Once | INTRAMUSCULAR | Status: AC
  Administered 2018-06-07: 17:00:00 2 mg via INTRAVENOUS

## 2018-06-07 MED ORDER — ZINC SULFATE 220 (50 ZN) MG PO CAPS
440.0000 mg | ORAL_CAPSULE | Freq: Two times a day (BID) | ORAL | Status: DC
  Administered 2018-06-07 – 2018-06-09 (×4): 440 mg via ORAL
  Filled 2018-06-07 (×4): qty 2

## 2018-06-07 MED ORDER — ALBUTEROL SULFATE HFA 108 (90 BASE) MCG/ACT IN AERS
2.0000 | INHALATION_SPRAY | RESPIRATORY_TRACT | Status: DC | PRN
  Administered 2018-06-17: 2 via RESPIRATORY_TRACT
  Filled 2018-06-07: qty 6.7

## 2018-06-07 MED ORDER — SODIUM CHLORIDE 0.9 % IV SOLN
500.0000 mg | INTRAVENOUS | Status: AC
  Administered 2018-06-07 – 2018-06-11 (×5): 500 mg via INTRAVENOUS
  Filled 2018-06-07 (×8): qty 500

## 2018-06-07 MED ORDER — HYDROXYCHLOROQUINE SULFATE 200 MG PO TABS
200.0000 mg | ORAL_TABLET | Freq: Two times a day (BID) | ORAL | Status: AC
  Administered 2018-06-07 – 2018-06-11 (×8): 200 mg via ORAL
  Filled 2018-06-07 (×8): qty 1

## 2018-06-07 MED ORDER — SODIUM CHLORIDE 0.9 % IV SOLN
1.0000 g | INTRAVENOUS | Status: AC
  Administered 2018-06-07 – 2018-06-12 (×6): 1 g via INTRAVENOUS
  Filled 2018-06-07 (×3): qty 10
  Filled 2018-06-07: qty 1
  Filled 2018-06-07 (×4): qty 10

## 2018-06-07 MED ORDER — ENOXAPARIN SODIUM 40 MG/0.4ML ~~LOC~~ SOLN: 40.0000 mg | SUBCUTANEOUS | Status: DC

## 2018-06-07 MED ORDER — ENOXAPARIN SODIUM 40 MG/0.4ML ~~LOC~~ SOLN
40.0000 mg | SUBCUTANEOUS | Status: DC
  Administered 2018-06-07 – 2018-06-12 (×6): 40 mg via SUBCUTANEOUS
  Filled 2018-06-07 (×7): qty 0.4

## 2018-06-07 MED ORDER — HYDROXYCHLOROQUINE SULFATE 200 MG PO TABS
400.0000 mg | ORAL_TABLET | Freq: Two times a day (BID) | ORAL | Status: AC
  Administered 2018-06-07: 400 mg via ORAL
  Filled 2018-06-07: qty 2

## 2018-06-07 MED ORDER — MIDAZOLAM HCL 2 MG/2ML IJ SOLN
INTRAMUSCULAR | Status: AC
  Filled 2018-06-07: qty 2

## 2018-06-07 MED ORDER — INSULIN ASPART 100 UNIT/ML ~~LOC~~ SOLN
0.0000 [IU] | Freq: Three times a day (TID) | SUBCUTANEOUS | Status: DC
  Administered 2018-06-07: 3 [IU] via SUBCUTANEOUS
  Administered 2018-06-07: 2 [IU] via SUBCUTANEOUS

## 2018-06-07 MED ORDER — FENTANYL CITRATE (PF) 100 MCG/2ML IJ SOLN
100.0000 ug | INTRAMUSCULAR | Status: AC | PRN
  Administered 2018-06-07 (×3): 100 ug via INTRAVENOUS
  Filled 2018-06-07 (×3): qty 2

## 2018-06-07 MED ORDER — SODIUM CHLORIDE 0.9% FLUSH
3.0000 mL | Freq: Two times a day (BID) | INTRAVENOUS | Status: DC
  Administered 2018-06-07 – 2018-06-08 (×5): 3 mL via INTRAVENOUS
  Administered 2018-06-09: 10 mL via INTRAVENOUS
  Administered 2018-06-09 – 2018-06-10 (×3): 3 mL via INTRAVENOUS
  Administered 2018-06-11: 10 mL via INTRAVENOUS
  Administered 2018-06-11 – 2018-06-18 (×14): 3 mL via INTRAVENOUS

## 2018-06-07 MED ORDER — SODIUM CHLORIDE 0.9% FLUSH: 3.0000 mL | INTRAVENOUS | Status: DC | PRN

## 2018-06-07 MED ORDER — SODIUM CHLORIDE 0.9% FLUSH
3.0000 mL | Freq: Two times a day (BID) | INTRAVENOUS | Status: DC
  Administered 2018-06-07 – 2018-06-08 (×3): 3 mL via INTRAVENOUS
  Administered 2018-06-09: 10 mL via INTRAVENOUS
  Administered 2018-06-10 – 2018-06-11 (×2): 3 mL via INTRAVENOUS
  Administered 2018-06-11: 10 mL via INTRAVENOUS
  Administered 2018-06-12 – 2018-06-16 (×6): 3 mL via INTRAVENOUS

## 2018-06-07 MED ORDER — SODIUM CHLORIDE 0.9% FLUSH: 3.0000 mL | Freq: Two times a day (BID) | INTRAVENOUS | Status: DC

## 2018-06-07 MED ORDER — INSULIN ASPART 100 UNIT/ML ~~LOC~~ SOLN: 0.0000 [IU] | Freq: Every day | SUBCUTANEOUS | Status: DC

## 2018-06-07 MED ORDER — BISACODYL 10 MG RE SUPP: 10.0000 mg | Freq: Every day | RECTAL | Status: DC | PRN

## 2018-06-07 MED ORDER — FENTANYL CITRATE (PF) 100 MCG/2ML IJ SOLN
INTRAMUSCULAR | Status: AC
  Filled 2018-06-07: qty 2

## 2018-06-07 MED ORDER — CHLORHEXIDINE GLUCONATE 0.12% ORAL RINSE (MEDLINE KIT)
15.0000 mL | Freq: Two times a day (BID) | OROMUCOSAL | Status: DC
  Administered 2018-06-07 – 2018-06-11 (×8): 15 mL via OROMUCOSAL

## 2018-06-07 MED ORDER — ACETAMINOPHEN 650 MG RE SUPP
650.0000 mg | Freq: Four times a day (QID) | RECTAL | Status: DC | PRN
  Administered 2018-06-08: 650 mg via RECTAL
  Filled 2018-06-07: qty 1

## 2018-06-07 MED ORDER — SODIUM CHLORIDE 0.9 % IV SOLN
250.0000 mL | INTRAVENOUS | Status: DC | PRN
  Administered 2018-06-07 – 2018-06-11 (×2): 250 mL via INTRAVENOUS

## 2018-06-07 MED ORDER — ONDANSETRON HCL 4 MG/2ML IJ SOLN
4.0000 mg | Freq: Four times a day (QID) | INTRAMUSCULAR | Status: DC | PRN
  Administered 2018-06-09 – 2018-06-10 (×2): 4 mg via INTRAVENOUS
  Filled 2018-06-07 (×2): qty 2

## 2018-06-07 MED ORDER — PROPOFOL 1000 MG/100ML IV EMUL
0.0000 ug/kg/min | INTRAVENOUS | Status: DC
  Administered 2018-06-07: 40 ug/kg/min via INTRAVENOUS
  Administered 2018-06-07: 17:00:00 50 ug/kg/min via INTRAVENOUS
  Administered 2018-06-08: 11:00:00 25 ug/kg/min via INTRAVENOUS
  Administered 2018-06-08: 08:00:00 35 ug/kg/min via INTRAVENOUS
  Administered 2018-06-08: 01:00:00 50 ug/kg/min via INTRAVENOUS
  Administered 2018-06-08: 23:00:00 30 ug/kg/min via INTRAVENOUS
  Administered 2018-06-08 – 2018-06-09 (×2): 50 ug/kg/min via INTRAVENOUS
  Administered 2018-06-09 (×2): 40 ug/kg/min via INTRAVENOUS
  Administered 2018-06-10 (×2): 50 ug/kg/min via INTRAVENOUS
  Administered 2018-06-10 (×3): 40 ug/kg/min via INTRAVENOUS
  Administered 2018-06-10: 50 ug/kg/min via INTRAVENOUS
  Filled 2018-06-07 (×5): qty 100
  Filled 2018-06-07: qty 200
  Filled 2018-06-07 (×13): qty 100

## 2018-06-07 MED ORDER — ETOMIDATE 2 MG/ML IV SOLN
20.0000 mg | Freq: Once | INTRAVENOUS | Status: AC
  Administered 2018-06-07: 17:00:00 20 mg via INTRAVENOUS

## 2018-06-07 MED ORDER — ORAL CARE MOUTH RINSE
15.0000 mL | OROMUCOSAL | Status: DC
  Administered 2018-06-07 – 2018-06-11 (×39): 15 mL via OROMUCOSAL

## 2018-06-07 MED ORDER — ROCURONIUM BROMIDE 50 MG/5ML IV SOLN
50.0000 mg | Freq: Once | INTRAVENOUS | Status: AC
  Administered 2018-06-07: 17:00:00 50 mg via INTRAVENOUS
  Filled 2018-06-07: qty 5

## 2018-06-07 MED ORDER — ACETAMINOPHEN 325 MG PO TABS
650.0000 mg | ORAL_TABLET | Freq: Four times a day (QID) | ORAL | Status: DC | PRN
  Administered 2018-06-07 – 2018-06-11 (×2): 650 mg via ORAL
  Filled 2018-06-07 (×3): qty 2

## 2018-06-07 MED ORDER — INSULIN ASPART 100 UNIT/ML ~~LOC~~ SOLN
0.0000 [IU] | SUBCUTANEOUS | Status: DC
  Administered 2018-06-07 (×2): 5 [IU] via SUBCUTANEOUS
  Administered 2018-06-07 – 2018-06-08 (×2): 2 [IU] via SUBCUTANEOUS
  Administered 2018-06-08 (×2): 3 [IU] via SUBCUTANEOUS
  Administered 2018-06-08: 5 [IU] via SUBCUTANEOUS
  Administered 2018-06-08: 3 [IU] via SUBCUTANEOUS
  Administered 2018-06-09 (×3): 5 [IU] via SUBCUTANEOUS
  Administered 2018-06-09: 13:00:00 8 [IU] via SUBCUTANEOUS
  Administered 2018-06-09: 09:00:00 5 [IU] via SUBCUTANEOUS
  Administered 2018-06-09 – 2018-06-10 (×2): 3 [IU] via SUBCUTANEOUS
  Administered 2018-06-10: 8 [IU] via SUBCUTANEOUS
  Administered 2018-06-10: 5 [IU] via SUBCUTANEOUS
  Administered 2018-06-10: 8 [IU] via SUBCUTANEOUS
  Administered 2018-06-10: 09:00:00 3 [IU] via SUBCUTANEOUS
  Administered 2018-06-10: 21:00:00 8 [IU] via SUBCUTANEOUS
  Administered 2018-06-10: 5 [IU] via SUBCUTANEOUS
  Administered 2018-06-11 (×2): 8 [IU] via SUBCUTANEOUS
  Administered 2018-06-11: 11:00:00 5 [IU] via SUBCUTANEOUS
  Administered 2018-06-11 (×2): 8 [IU] via SUBCUTANEOUS
  Administered 2018-06-12: 22:00:00 5 [IU] via SUBCUTANEOUS
  Administered 2018-06-12: 8 [IU] via SUBCUTANEOUS
  Administered 2018-06-12 (×5): 5 [IU] via SUBCUTANEOUS
  Administered 2018-06-13: 8 [IU] via SUBCUTANEOUS
  Administered 2018-06-13 (×2): 5 [IU] via SUBCUTANEOUS
  Administered 2018-06-13: 3 [IU] via SUBCUTANEOUS
  Administered 2018-06-13 (×2): 5 [IU] via SUBCUTANEOUS
  Administered 2018-06-14 (×2): 3 [IU] via SUBCUTANEOUS

## 2018-06-07 MED ORDER — FENTANYL CITRATE (PF) 100 MCG/2ML IJ SOLN
100.0000 ug | Freq: Once | INTRAMUSCULAR | Status: AC
  Administered 2018-06-07: 17:00:00 100 ug via INTRAVENOUS

## 2018-06-07 MED ORDER — SODIUM CHLORIDE 0.9 % IV SOLN: 250.0000 mL | INTRAVENOUS | Status: DC | PRN

## 2018-06-07 MED ORDER — FENTANYL CITRATE (PF) 100 MCG/2ML IJ SOLN
100.0000 ug | INTRAMUSCULAR | Status: DC | PRN
  Administered 2018-06-08 – 2018-06-10 (×9): 100 ug via INTRAVENOUS
  Filled 2018-06-07 (×10): qty 2

## 2018-06-07 NOTE — H&P (Signed)
History and Physical    Adrian Mclean JYN:[id number] DOB: Jul 15, 1960 DOA: 06/07/2018  PCP: [place]n [place], [facility]   Patient coming from: Home, by way of [facility]   Chief Complaint: SOB, fevers   HPI: Adrian AlarDanny Mclean is a 58 y.o. male with medical history significant for type 2 diabetes mellitus, now presenting to the emergency department for evaluation of shortness of breath.  Patient had been seen in the ED on 06/02/2018 complaining of 1 week of fever, cough, and diarrhea.  He tested positive for COVID-19 on 06/02/2018.  He completed a course of Tamiflu, azithromycin, and cefdinir without any improvement.  He has been taking Tylenol for fevers at home.  He now returns to the ED with worsening shortness of breath, non-productive cough, and ongoing fevers. He denies chest pain or leg swelling.  He denies any abdominal pain or vomiting, denies melena or hematochezia, and reports last loose stool was yesterday morning.  [facility] ED Course: Upon arrival to the ED, patient is found to be febrile to 38.1 C, saturating mid 90s on 2 L/min of supplemental oxygen, and with vitals otherwise normal.  EKG features normal sinus rhythm with QTc interval 470 ms.  Chest x-ray is concerning for worsening bilateral airspace opacities in a pattern consistent with COVID-19.  Chemistry panel is notable for slight elevation in AST and total bilirubin, and CBC features a slight thrombocytopenia.  Blood cultures were collected and the patient was treated with Rocephin and azithromycin in the ED.  ED physician discussed the case with ID and the patient was given a dose of hydroxychloroquine.  [facility].  Review of Systems:  All other systems reviewed and apart from HPI, are negative.  Past Medical History:  Diagnosis Date  . Diabetes mellitus without complication [facility](HCC)     Past Surgical History:  Procedure Laterality Date  . APPENDECTOMY       reports  that he has never smoked. He has never used smokeless tobacco. He reports that he does not drink alcohol or use drugs.  Allergies  Allergen Reactions  . Codeine Hives and Other (See Comments)    My body does not like it    History reviewed. No pertinent family history.   Prior to Admission medications   Medication Sig Start Date End Date Taking? Authorizing Provider  albuterol (PROVENTIL HFA;VENTOLIN HFA) 108 (90 Base) MCG/ACT inhaler Inhale 2 puffs into the lungs every 4 (four) hours as needed for shortness of breath. 06/02/18   Dionne BucySiadecki, Sebastian, MD    Physical Exam: Vitals:   06/07/18 0045  BP: 107/80  Pulse: 85  Resp: 20  Temp: 98.8 F (37.1 C)  TempSrc: Oral  SpO2: 92%  Weight: 96.6 kg  Height: 5\' 9"  (1.753 m)    Constitutional: NAD, calm  Eyes: PERTLA, lids and conjunctivae normal ENMT: Mucous membranes are moist. Posterior pharynx clear of any exudate or lesions.   Neck: normal, supple, no masses, no thyromegaly Respiratory: No pallor or cyanosis. Normal respiratory effort. No accessory muscle use.  Cardiovascular: S1 & S2 heard, regular rate and rhythm. No extremity edema.   Abdomen: No distension, no tenderness, soft. Bowel sounds active.  Musculoskeletal: no clubbing / cyanosis. No joint deformity upper and lower extremities.    Skin: no significant rashes, lesions, ulcers. Warm, dry, well-perfused. Neurologic: No facial asymmetry. Sensation intact. Moving all extremities.  Psychiatric: Alert and oriented x 3. Calm, cooperative.   Labs on Admission: I have  personally reviewed following labs and imaging studies  CBC: Recent Labs  Lab 06/02/18 1744 06/06/18 1836  WBC 5.3 6.1  NEUTROABS 3.3 5.0  HGB 15.1 14.6  HCT 43.6 42.4  MCV 91.4 91.4  PLT 117* 144*   Basic Metabolic Panel: Recent Labs  Lab 06/02/18 1744 06/06/18 1836  NA 133* 135  K 3.7 3.7  CL 98 99  CO2 22 21*  GLUCOSE 138* 139*  BUN 14 16  CREATININE 0.76 0.75  CALCIUM 8.5* 8.4*    GFR: Estimated Creatinine Clearance: 115.5 mL/min (by C-G formula based on SCr of 0.75 mg/dL). Liver Function Tests: Recent Labs  Lab 06/06/18 1836  AST 46*  ALT 32  ALKPHOS 57  BILITOT 1.4*  PROT 7.8  ALBUMIN 4.1   No results for input(s): LIPASE, AMYLASE in the last 168 hours. No results for input(s): AMMONIA in the last 168 hours. Coagulation Profile: No results for input(s): INR, PROTIME in the last 168 hours. Cardiac Enzymes: Recent Labs  Lab 06/06/18 1836 06/06/18 1842  CKTOTAL  --  110  TROPONINI <0.03  --    BNP (last 3 results) No results for input(s): PROBNP in the last 8760 hours. HbA1C: No results for input(s): HGBA1C in the last 72 hours. CBG: Recent Labs  Lab 06/06/18 2156  GLUCAP 131*   Lipid Profile: No results for input(s): CHOL, HDL, LDLCALC, TRIG, CHOLHDL, LDLDIRECT in the last 72 hours. Thyroid Function Tests: No results for input(s): TSH, T4TOTAL, FREET4, T3FREE, THYROIDAB in the last 72 hours. Anemia Panel: Recent Labs    06/06/18 1842  FERRITIN 894*   Urine analysis: No results found for: COLORURINE, APPEARANCEUR, LABSPEC, PHURINE, GLUCOSEU, HGBUR, BILIRUBINUR, KETONESUR, PROTEINUR, UROBILINOGEN, NITRITE, LEUKOCYTESUR Sepsis Labs: (procalcitonin:4,lacticidven:4) ) Recent Results (from the past 240 hour(s))  Novel Coronavirus, NAA (hospital order; send-out to ref lab)     Status: Abnormal   Collection Time: 06/02/18  6:26 PM  Result Value Ref Range Status   SARS-CoV-2, NAA DETECTED (A) NOT DETECTED Final    Comment: CRITICAL RESULT CALLED TO, READ BACK BY AND VERIFIED WITH: Marlyn Corporal RN 06/05/2018 0953 BEAMJ PUVA Performed at [facility], [address], [place], [place] [address]    Coronavirus Source NASOPHARYNGEAL  Final    Comment: Performed at [facility], [address] [place] [place], [place] [address]     Radiological Exams on Admission: Dg Chest Portable 1 View  Result Date: 06/06/2018 CLINICAL  DATA:  Increasing shortness of breath. Recent diagnosis of COVID-19. EXAM: PORTABLE CHEST 1 VIEW COMPARISON:  Radiograph 06/02/2018 FINDINGS: Progressive bilateral airspace opacities in the peripheral and mid lower lung zone predominance. Lung volumes are low. Unchanged heart size and mediastinal contours. No large pleural effusion or pneumothorax. No acute osseous abnormalities. IMPRESSION: Worsening bilateral airspace opacities over the past 4 days. Pattern consistent with diagnosis of COVID-19. Electronically Signed   By: Narda Rutherford M.D.   On: 06/06/2018 18:59    EKG: Independently reviewed. Sinus rhythm, QTc 417 ms.   Assessment/Plan   1. COVID-19; acute hypoxic respiratory failure  - Presents with worsening SOB and ongoing fevers after developing sxs almost 2 wks ago and testing positive for COVID-19 on 06/02/18  - He is febrile in ED, requiring 4 Lpm of supplemental O2, and has worsening bilateral opacities on CXR  - Blood cultures were collected in ED, Rocephin and azithromycin was started, and hydroxychloroquine was given after ED physician conferred with ID  - Check sputum culture and strep pneumo  antigen, continue Rocephin and azithromycin for now, continue hydroxychloriquine with monitoring of QT-interval, continue infection prevention measures with airborne and contact precautions, and continue supplemental O2 as needed   2. Type II DM  - A1c was 6.7% in September  - Check CBG's and use a SSI with Novolog while in hospital     PPE: Gloves, gown, and CAPR worn in patient's room.   DVT prophylaxis: Lovenox  Code Status: Full  Family Communication: Discussed with patient  Consults called: ED physician discussed with ID  Admission status: Inpatient; patient has worsening supplemental O2-requirement and is at high-risk for life-threatening complications without close inpatient monitoring and treatment   Briscoe Deutscher, MD [facility] Pager [phone]  If 7PM-7AM,  please contact night-coverage [email or web address] Password [email or web address]  06/07/2018, 1:18 AM

## 2018-06-07 NOTE — Procedures (Signed)
Intubation Procedure Note [name] [id number] [birth year]  Procedure: Intubation Indications: Respiratory insufficiency  Procedure Details Consent: Risks of procedure as well as the alternatives and risks of each were explained to the (patient/caregiver).  Consent for procedure obtained. Time Out: Verified patient identification, verified procedure, site/side was marked, verified correct patient position, special equipment/implants available, medications/allergies/relevent history reviewed, required imaging and test results available.  Performed  Maximum sterile technique was used including antiseptics, cap, gloves, gown, hand hygiene, mask and sheet.  MAC    Evaluation Hemodynamic Status: BP stable throughout; O2 sats: stable throughout Patient's Current Condition: stable Complications: No apparent complications Patient did tolerate procedure well. Chest X-ray ordered to verify placement.  CXR: pending.   Adrian Mclean 06/07/2018

## 2018-06-07 NOTE — Progress Notes (Addendum)
PCCM Brief Interval Note  PCCM consulted this morning for confirmed COVID-19 positive patient. Over the course of the day, patient with progressive hypoxia, with increasing supplemental O2 requirement from [facility] to 100% non-rebreather + 15LNC over the course of the day.  Patient is not a candidate for NPPV due to COVID-19 status. Due to his progressive acute hypoxic respiratory failure, he is being transferred to ICU where he will be intubated.  At time of transfer patient tachypnic, sinus tach, with signs of increased work of breathing. Patient hypertensive.  VS: RR 26, HR 107, BP 175/71  Acute Hypoxic Respiratory Failure -COVID-19 P Intubate now Full vent support per ARDS protocol Consider early prone if worsening  SpO2 goal 88-92%  ABG in 1 hr CXR to confirm placement CXR in AM Pulm hygiene  Toci x1 now, evaluate in 12 hours for benefit and consider second dose.  Continue Hydroxychloriquine, Azithromycin, rocephin Follow up sputum cx, strep pneumo antigen  Will start propofol + PRN fentanyl    Critical Care Time: 30 min   Tessie Fass MSN, AGACNP-BC [place] Pulmonary/Critical Care Medicine [phone] If no answer, [phone] 06/07/2018, 4:57 PM  Attending Note:  58 year old male with known COVID-19 developing worsening hypoxemic respiratory failure.  Patient was seen earlier on 4L and promptly deteriorated 9 hours later and was brought to the ICU and intubated.  Rest of the note as above.  Will add zinc.  The patient is critically ill with multiple organ systems failure and requires high complexity decision making for assessment and support, frequent evaluation and titration of therapies, application of advanced monitoring technologies and extensive interpretation of multiple databases.   Critical Care Time devoted to patient care services described in this note is  90  Minutes. This time reflects time of care of this signee Dr Koren Bound. This critical care time does not  reflect procedure time, or teaching time or supervisory time of PA/NP/Med student/Med Resident etc but could involve care discussion time.  Alyson Reedy, M.D. [facility] Pulmonary/Critical Care Medicine. Pager: [phone]. After hours pager: [phone].

## 2018-06-07 NOTE — Progress Notes (Addendum)
The patient was admitted early this morning after midnight and H&P has been reviewed and I am in current agreement with assessment and plan done by Dr. Odie Seraimothy Opyd.  Additional changes the plan of care have been made accordingly including a Pulmonary Consult.  Essentially the patient is a 58 year old Caucasian male with a past medical history significant for diabetes mellitus type 2 [facility] with a chief complaint of shortness of breath and fevers was [facility].  He was seen in the ED on 06/02/2018 complaining of a fever for a week, cough and diarrhea.  He tested positive for COVID-19 on 06/02/2018 and was discharged home at that time on a course of Tamiflu, azithromycin and cefdinir without any improvement.  He has been taking Tylenol at home for his fevers.  He now returns to the ED with worsening shortness of breath, nonproductive cough and ongoing fevers.    Upon arrival to the ED he was found to be febrile and was saturating in the mid 90s on 2 L of supplemental oxygen.  Chest x-ray was concerning for worsening bilateral airspace opacities and pattern consistent with COVID-19 disease.  ED physician discussed with ID and he was given a dose of hydroxychloroquine and he will be continued on hydroxychloroquine at this time with infection prevention measures with airborne contact precaution.  Will continue supplemental oxygen via nasal cannula and continue empiric antibiotics with IV Rocephin and IV azithromycin.  Because of his worsening hypoxia critical care pulmonary was consulted for further evaluation and recommendations and Dr. Molli KnockYacoub recommends titrating O2 for saturation of 88 to 92% with going up to 15 L of high flow nasal cannula with a mask if needed.  Per PCCM will not he is on NIV and if NIV is needed then will call PCCM for intubation but will try to avoid this for now.  He is currently being Treated for the following:  COVID-19; Acute Hypoxic  Respiratory Failure - ARDS - Presented with worsening SOB and ongoing fevers after developing sxs almost 2 wks ago and testing positive for COVID-19 on 06/02/18  -He was febrile in ED, requiring 4 Lpm of supplemental O2 and now on 5 Liters saturating 90% -Has worsening bilateral opacities on CXR  -Blood cultures were collected in ED -Rocephin and Azithromycin was started, and hydroxychloroquine was given after ED physician conferred with ID -patient's platelet count was also low on admission at 144 -Acute phase reactant ferritin is also elevated at 894 fibrin narratives d[facility]dimer at [facility] was 1985.26 -Procalcitonin level is less than 0.10 as well as strep pneumo urine antigens Negative -Check sputum culture and strep pneumo antigen, continue Rocephin and azithromycin for now,  -Continue Hydroxychloriquine with monitoring of QT-interval, -Continue infection prevention measures with airborne and contact precautions,  -Continue supplemental O2 as needed  -Discussed with ID and H Score is <1% so likely not a good Candidate for Tocilizumab  Type II DM  -A1c was 6.7% in September  -Check CBG's and use a SSI with Novolog while in hospital -CBGs are ranging from 131-153  We will continue monitor patient's clinical response to intervention and repeat blood work and imaging studies with a CXR in the a.m.  **ADDENDUM 15:30 Patient desaturated this afternoon to 70% and had to be placed on a nonrebreather.  He was then transitioned to 15 L high flow nasal cannula and saturating 92%.  Patient was prone and because of his worsening respiratory status CCM was called again  who will take over as primary and patient likely be transferred to the ICU under the care of Dr. Craige Cotta. Updated wife over the phone about plan of Care and Critical Care Involvement

## 2018-06-07 NOTE — Progress Notes (Signed)
1440 Patient's oxygen dropped into 70s. Patient's nasal cannula disconnected from wall O2. Reconnected back to oxygen but unable to maintain stats above mid 80s.  Pt placed on NRB at 15L.  1455 MD made aware and was given orders to attempt to wean patient if able.   1325 Patient transition to HFNC at 15L. Patient then made prone and O2 sat maintain at  Low 90s. Patient reports no chest pain and minimal SOB. MD then notified and updated about current patient status.   1335 MD returned paged and notified this RN that the patient would be transferred to ICU.

## 2018-06-07 NOTE — Progress Notes (Signed)
Patient arrived to 2M13 from 2W.  Patient was alert and oriented at the time; patient updated to plan of care by Dr. Molli Knock. RT, MD and RN at bedside for RSI, central line and aline placement.

## 2018-06-07 NOTE — Procedures (Signed)
Central Venous Catheter Insertion Procedure Note Adrian Mclean [id number] June 28, 1960  Procedure: Insertion of Central Venous Catheter Indications: Drug and/or fluid administration  Procedure Details Consent: Risks of procedure as well as the alternatives and risks of each were explained to the (patient/caregiver).  Consent for procedure obtained. Time Out: Verified patient identification, verified procedure, site/side was marked, verified correct patient position, special equipment/implants available, medications/allergies/relevent history reviewed, required imaging and test results available.  Performed  Maximum sterile technique was used including antiseptics, cap, gloves, gown, hand hygiene, mask and sheet. Skin prep: Chlorhexidine; local anesthetic administered A antimicrobial bonded/coated triple lumen catheter was placed in the left subclavian vein using the Seldinger technique.  Evaluation Blood flow good Complications: No apparent complications Patient did tolerate procedure well. Chest X-ray ordered to verify placement.  CXR: pending.  Adrian Mclean 06/07/2018, 5:17 PM

## 2018-06-07 NOTE — Consult Note (Signed)
NAME:  Adrian Mclean, MRN:  [id number], DOB:  [birth year], LOS: 0 ADMISSION DATE:  06/07/2018, CONSULTATION DATE:  06/07/2018 REFERRING MD:  Darcel Smalling Marland Mcalpine, CHIEF COMPLAINT:  Worsening hypoxemia   Brief History   58 year male with PMH of DM who was diagnosed with COVID-19 on 4/3 who presented to [facility] with fever, cough, SOB and diarrhea.  EDP in [facility] discussed with ID and hydroxychloroquine was started and patient was transferred to [facility] for admission.  Patient was placed on 4L [place] and transferred to [facility]  PCCM was consulted on 4/8 for worsening hypoxemia.  Patient evidently finished a course of tamiflu, zithromax and cefdinir as outpatient with no improvement.  Started on plaquenil, rocephin and zithromax again in the hospital upon admission at 1 AM on 4/8.  History of present illness   58 year male with PMH of DM who was diagnosed with COVID-19 on 4/3 who presented to [facility] with fever, cough, SOB and diarrhea.  EDP in [facility] discussed with ID and hydroxychloroquine was started and patient was transferred to [facility] for admission.  Patient was placed on 4L [place] and transferred to [facility].  PCCM was consulted on 4/8 for worsening hypoxemia.  Patient evidently finished a course of tamiflu, zithromax and cefdinir as outpatient with no improvement.  Started on plaquenil, rocephin and zithromax again in the hospital upon admission at 1 AM on 4/8.  Past Medical History  DM  Significant Hospital Events   Worsening hypoxemia 4/8 at 4L [place] saturating 92%  Consults:  PCCM  Procedures:  N/A  Significant Diagnostic Tests:  CXR that I reviewed myself that shows progressive worsening of bilateral infiltrate  Micro Data:  MRSA 4/8 negative COVID 4/3 positive  Antimicrobials:  Plaquenil 4/7>>> Rocephin 4/7>>> Zithromax 4/7>>>   Interim history/subjective:  Worsening hypoxemia but no further events  Objective   Blood pressure (!) 153/68, pulse 88, temperature 99.9 F (37.7 C), temperature source Oral, resp. rate  19, height 5\' 9"  (1.753 m), weight 96.6 kg, SpO2 90 %.       No intake or output data in the 24 hours ending 06/07/18 0820 Filed Weights   06/07/18 0045  Weight: 96.6 kg    Examination: General: Comfortable appearing on 4L [place] HENT: [place]/AT, PERRL, EOM-I and MMM Lungs: Coarse BS diffusely Cardiovascular: RRR, Nl S1/S2 and -M/R/G Abdomen: Soft, NT, ND and +BS Extremities: -edema and -tenderness Neuro: Alert and interactive, moving all ext to command Skin: Intact  Resolved Hospital Problem list   N/A  Discussed with TR[facility]-MD  Assessment & Plan:  Acute hypoxemic respiratory failure: due to COVID-19  - Plaquenil  - Zithromax  - May consider ID consult for further recommendations on treatment of COVID-19  - Keep dry as able  Hypoxemia:  - Titrate O2 for sat of 88-92%  - May go up to 15L HFNC with a mask if needed  - No NIV, if NIV is needed then call PCCM for intubation, not required for now  - If worsens or requires intubation please call PCCM for transfer to the ICU and intubation, but try to avoid for now as much as possible.  DM:  - Tight blood sugar control  PCCM will sign off, please call back if patient worsens and needs transfer to the ICU, appears comfortable for now.  Labs   CBC: Recent Labs  Lab 06/02/18 1744 06/06/18 1836  WBC 5.3 6.1  NEUTROABS 3.3 5.0  HGB 15.1 14.6  HCT 43.6 42.4  MCV 91.4 91.4  PLT 117* 144*    Basic Metabolic Panel: Recent Labs  Lab 06/02/18 1744 06/06/18 1836  NA 133* 135  K 3.7 3.7  CL 98 99  CO2 22 21*  GLUCOSE 138* 139*  BUN 14 16  CREATININE 0.76 0.75  CALCIUM 8.5* 8.4*   GFR: Estimated Creatinine Clearance: 115.5 mL/min (by C-G formula based on SCr of 0.75 mg/dL). Recent Labs  Lab 06/02/18 1744 06/06/18 1836 06/06/18 1838 06/06/18 1842  PROCALCITON  --   --   --  <0.10  WBC 5.3 6.1  --   --   LATICACIDVEN 1.4  --  1.1  --     Liver Function Tests: Recent Labs  Lab 06/06/18 1836  AST 46*  ALT 32   ALKPHOS 57  BILITOT 1.4*  PROT 7.8  ALBUMIN 4.1   No results for input(s): LIPASE, AMYLASE in the last 168 hours. No results for input(s): AMMONIA in the last 168 hours.  ABG    Component Value Date/Time   HCO3 22.6 06/06/2018 1803   ACIDBASEDEF 1.1 06/06/2018 1803   O2SAT 86.8 06/06/2018 1803     Coagulation Profile: No results for input(s): INR, PROTIME in the last 168 hours.  Cardiac Enzymes: Recent Labs  Lab 06/06/18 1836 06/06/18 1842  CKTOTAL  --  110  TROPONINI <0.03  --     HbA1C: No results found for: HGBA1C  CBG: Recent Labs  Lab 06/06/18 2156 06/07/18 0806  GLUCAP 131* 153*    Review of Systems:   12 point ROS is negative other than above  Past Medical History  He,  has a past medical history of Diabetes mellitus without complication (HCC).   Surgical History    Past Surgical History:  Procedure Laterality Date  . APPENDECTOMY       Social History   reports that he has never smoked. He has never used smokeless tobacco. He reports that he does not drink alcohol or use drugs.   Family History   His family history is not on file.   Allergies Allergies  Allergen Reactions  . Codeine Hives and Other (See Comments)    My body does not like it     Home Medications  Prior to Admission medications   Medication Sig Start Date End Date Taking? Authorizing Provider  albuterol (PROVENTIL HFA;VENTOLIN HFA) 108 (90 Base) MCG/ACT inhaler Inhale 2 puffs into the lungs every 4 (four) hours as needed for shortness of breath. 06/02/18   Dionne BucySiadecki, Sebastian, MD    Alyson ReedyWesam G. Donevin Sainsbury, M.D. [facility] Pulmonary/Critical Care Medicine. Pager: [phone]. After hours pager: [phone].

## 2018-06-07 NOTE — Procedures (Signed)
OGT Placement By MD  OGT placed under direct laryngoscopy while patient is under the intubation barrier and verified by auscultation  Alyson Reedy, M.D. [facility] Pulmonary/Critical Care Medicine. Pager: [phone]. After hours pager: [phone].

## 2018-06-07 NOTE — ED Notes (Signed)
Report called to Roselle RN at [facility].

## 2018-06-07 NOTE — Procedures (Signed)
Arterial Catheter Insertion Procedure Note Adrian Mclean [id number] [birth year]  Procedure: Insertion of Arterial Catheter  Indications: Blood pressure monitoring and Frequent blood sampling  Procedure Details Consent: Unable to obtain consent because of emergent medical necessity. Time Out: Verified patient identification, verified procedure, site/side was marked, verified correct patient position, special equipment/implants available, medications/allergies/relevent history reviewed, required imaging and test results available.  Performed  Maximum sterile technique was used including antiseptics, cap, gloves, gown, hand hygiene, mask and sheet. Skin prep: Chlorhexidine; local anesthetic administered 20 gauge catheter was inserted into right radial artery using the Seldinger technique. ULTRASOUND GUIDANCE USED: NO Evaluation Blood flow good; BP tracing good. Complications: No apparent complications.   Koren Bound 06/07/2018

## 2018-06-08 LAB — CK: Total CK: 160 U/L (ref 49–397)

## 2018-06-08 LAB — POCT I-STAT 7, (LYTES, BLD GAS, ICA,H+H)
Acid-Base Excess: 1 mmol/L (ref 0.0–2.0)
Bicarbonate: 26 mmol/L (ref 20.0–28.0)
Calcium, Ion: 1.12 mmol/L — ABNORMAL LOW (ref 1.15–1.40)
HCT: 38 % — ABNORMAL LOW (ref 39.0–52.0)
Hemoglobin: 12.9 g/dL — ABNORMAL LOW (ref 13.0–17.0)
O2 Saturation: 100 %
Patient temperature: 98.7
Potassium: 3.6 mmol/L (ref 3.5–5.1)
Sodium: 137 mmol/L (ref 135–145)
TCO2: 27 mmol/L (ref 22–32)
pCO2 arterial: 43.5 mmHg (ref 32.0–48.0)
pH, Arterial: 7.384 (ref 7.350–7.450)
pO2, Arterial: 286 mmHg — ABNORMAL HIGH (ref 83.0–108.0)

## 2018-06-08 LAB — CBC WITH DIFFERENTIAL/PLATELET
Abs Immature Granulocytes: 0.3 10*3/uL — ABNORMAL HIGH (ref 0.00–0.07)
Basophils Absolute: 0 10*3/uL (ref 0.0–0.1)
Basophils Relative: 0 %
Eosinophils Absolute: 0 10*3/uL (ref 0.0–0.5)
Eosinophils Relative: 0 %
HCT: 41.3 % (ref 39.0–52.0)
Hemoglobin: 13.7 g/dL (ref 13.0–17.0)
Lymphocytes Relative: 4 %
Lymphs Abs: 0.4 10*3/uL — ABNORMAL LOW (ref 0.7–4.0)
MCH: 30.9 pg (ref 26.0–34.0)
MCHC: 33.2 g/dL (ref 30.0–36.0)
MCV: 93.2 fL (ref 80.0–100.0)
Monocytes Absolute: 0.5 10*3/uL (ref 0.1–1.0)
Monocytes Relative: 5 %
Myelocytes: 3 %
Neutro Abs: 9.3 10*3/uL — ABNORMAL HIGH (ref 1.7–7.7)
Neutrophils Relative %: 88 %
Platelets: 185 10*3/uL (ref 150–400)
RBC: 4.43 MIL/uL (ref 4.22–5.81)
RDW: 12.7 % (ref 11.5–15.5)
WBC: 10.6 10*3/uL — ABNORMAL HIGH (ref 4.0–10.5)
nRBC: 0 % (ref 0.0–0.2)
nRBC: 0 /100 WBC

## 2018-06-08 LAB — TRIGLYCERIDES: Triglycerides: 109 mg/dL (ref <150)

## 2018-06-08 LAB — COMPREHENSIVE METABOLIC PANEL
ALT: 37 U/L (ref 0–44)
AST: 54 U/L — ABNORMAL HIGH (ref 15–41)
Albumin: 3 g/dL — ABNORMAL LOW (ref 3.5–5.0)
Alkaline Phosphatase: 47 U/L (ref 38–126)
Anion gap: 12 (ref 5–15)
BUN: 20 mg/dL (ref 6–20)
CO2: 23 mmol/L (ref 22–32)
Calcium: 8.4 mg/dL — ABNORMAL LOW (ref 8.9–10.3)
Chloride: 101 mmol/L (ref 98–111)
Creatinine, Ser: 1.15 mg/dL (ref 0.61–1.24)
GFR calc Af Amer: >60 mL/min (ref >60)
GFR calc non Af Amer: >60 mL/min (ref >60)
Glucose, Bld: 150 mg/dL — ABNORMAL HIGH (ref 70–99)
Potassium: 3.5 mmol/L (ref 3.5–5.1)
Sodium: 136 mmol/L (ref 135–145)
Total Bilirubin: 0.8 mg/dL (ref 0.3–1.2)
Total Protein: 6.6 g/dL (ref 6.5–8.1)

## 2018-06-08 LAB — GLUCOSE, CAPILLARY
Glucose-Capillary: 135 mg/dL — ABNORMAL HIGH (ref 70–99)
Glucose-Capillary: 151 mg/dL — ABNORMAL HIGH (ref 70–99)
Glucose-Capillary: 193 mg/dL — ABNORMAL HIGH (ref 70–99)
Glucose-Capillary: 227 mg/dL — ABNORMAL HIGH (ref 70–99)
Glucose-Capillary: 193 mg/dL — ABNORMAL HIGH (ref 70–99)

## 2018-06-08 LAB — C-REACTIVE PROTEIN: CRP: 19.6 mg/dL — ABNORMAL HIGH (ref <1.0)

## 2018-06-08 LAB — MAGNESIUM: Magnesium: 2.3 mg/dL (ref 1.7–2.4)

## 2018-06-08 LAB — PHOSPHORUS: Phosphorus: 2.6 mg/dL (ref 2.5–4.6)

## 2018-06-08 LAB — POCT ACTIVATED CLOTTING TIME: Activated Clotting Time: 0 seconds

## 2018-06-08 MED ORDER — PRO-STAT SUGAR FREE PO LIQD
90.0000 mL | Freq: Two times a day (BID) | ORAL | Status: DC
  Administered 2018-06-08 – 2018-06-11 (×6): 90 mL
  Filled 2018-06-08 (×7): qty 90

## 2018-06-08 MED ORDER — PANTOPRAZOLE SODIUM 40 MG PO PACK
40.0000 mg | PACK | Freq: Every day | ORAL | Status: DC
  Administered 2018-06-08 – 2018-06-11 (×4): 40 mg
  Filled 2018-06-08 (×5): qty 20

## 2018-06-08 MED ORDER — POTASSIUM CHLORIDE 20 MEQ/15ML (10%) PO SOLN
40.0000 meq | Freq: Once | ORAL | Status: AC
  Administered 2018-06-08: 12:00:00 40 meq
  Filled 2018-06-08: qty 30

## 2018-06-08 MED ORDER — VITAL HIGH PROTEIN PO LIQD
1000.0000 mL | ORAL | Status: DC
  Administered 2018-06-08 – 2018-06-09 (×2): 1000 mL

## 2018-06-08 MED ORDER — INSULIN GLARGINE 100 UNIT/ML ~~LOC~~ SOLN
5.0000 [IU] | Freq: Every day | SUBCUTANEOUS | Status: DC
  Administered 2018-06-08 – 2018-06-09 (×2): 5 [IU] via SUBCUTANEOUS
  Filled 2018-06-08 (×3): qty 0.05

## 2018-06-08 MED ORDER — FUROSEMIDE 10 MG/ML IJ SOLN
40.0000 mg | Freq: Once | INTRAMUSCULAR | Status: AC
  Administered 2018-06-08: 40 mg via INTRAVENOUS
  Filled 2018-06-08: qty 4

## 2018-06-08 NOTE — Progress Notes (Addendum)
NAME:  Adrian Mclean, MRN:  [id number], DOB:  1960/05/19, LOS: 1 ADMISSION DATE:  06/07/2018, CONSULTATION DATE:  06/07/2018 REFERRING MD:  [name], CHIEF COMPLAINT:  Worsening hypoxemia   Brief History   [age] year male with PMH of DM who was diagnosed with COVID-19 on 4/3 who [facility] with fever, cough, SOB and diarrhea.  [facility] with ID and hydroxychloroquine was started and patient [facility].  Patient was placed on 4L [place] and [facility]  PCCM was consulted on 4/8 for worsening hypoxemia.  Patient evidently finished a course of tamiflu, zithromax and cefdinir as outpatient with no improvement.  Started on plaquenil, rocephin and zithromax again in the hospital upon admission at 1 AM on 4/8.  History of present illness   58 year male with PMH of DM who was diagnosed with COVID-19 on 4/3 who [facility] with fever, cough, SOB and diarrhea.  [facility] with ID and hydroxychloroquine was started and [facility].  Patient was placed on 4L [place] and [facility]  PCCM was consulted on 4/8 for worsening hypoxemia.  Patient evidently finished a course of tamiflu, zithromax and cefdinir as outpatient with no improvement.  Started on plaquenil, rocephin and zithromax again in the hospital upon admission at 1 AM on 4/8.  Past Medical History  DM  Significant Hospital Events   Worsening hypoxemia 4/8 at 4L [place] saturating 92% 06/07/2018 decompensated requiring intubation  Consults:  PCCM  Procedures:  N/A  Significant Diagnostic Tests:  CXR that I reviewed myself that shows progressive worsening of bilateral infiltrate 06/08/2018 chest x-ray with bilateral airspace disease lines and tubes around the  Micro Data:  MRSA 4/8 negative COVID 4/3 positive  Antimicrobials:  Plaquenil 4/7>>> Rocephin 4/7>>> Zithromax 4/7>>>   Interim history/subjective:  Required intubation June 07, 2018  with worsening hypoxia.  Objective   Blood pressure (!) 104/53, pulse 84, temperature 98.7 F (37.1 C), temperature source Axillary, resp. rate (!) 28, height 5\' 9"  (1.753 m), weight 96.6 kg, SpO2 100 %.    Vent Mode: PRVC FiO2 (%):  [80 %-100 %] 80 % Set Rate:  [24 bmp-28 bmp] 28 bmp Vt Set:  [420 mL] 420 mL PEEP:  [16 cmH20] 16 cmH20 Plateau Pressure:  [25 cmH20-26 cmH20] 26 cmH20   Intake/Output Summary (Last 24 hours) at 06/08/2018 [id number] Last data filed at 06/08/2018 0600 Gross per 24 hour  Intake 938.49 ml  Output 695 ml  Net 243.49 ml   Filed Weights   06/07/18 0045  Weight: 96.6 kg    Examination: General: 58 year old who decompensated on 06/07/2018 requiring intubation currently heavily sedated on full mechanical ventilatory support HEENT: Endotracheal tube is in place, gastric tube in place Neuro: Currently heavily sedated with propofol CV: Heart sounds are regular PULM: Decreased breath sounds throughout. Pressure regulated volume control with a rate of 28 and not overbreathing at this time.  PEEP of 15 with O2 saturations of 100% .  e P to F ratio  Follow up   317, fio2 decreased to 70                                                    JX:BJYNGI:soft, non-tender, bsx4 active  Extremities: warm/dry, negative edema  Skin: no rashes or lesions   Resolved Hospital Problem list     Assessment & Plan:  Acute hypoxemic respiratory failure: due to COVID-19.  Hypoxemia Plan ARDS protocol ventilator set up Zithromax Plaquenil Remains in intensive care unit Ventilator adjustments as needed Diuresis with 40 mg with K+ repletion 4/9 Sedation as needed to maintain ventilator tolerated   DM: CBG (last 3)  Recent Labs    06/07/18 2354 06/08/18 0354 06/08/18 0827  GLUCAP 138* 135* 151*   Plan: Sliding-scale insulin protocol Nutrition consult      Labs   CBC: Recent Labs  Lab 06/02/18 1744 06/06/18 1836 06/07/18 1807 06/08/18 0406  WBC 5.3 6.1  --  10.6*   NEUTROABS 3.3 5.0  --  9.3*  HGB 15.1 14.6 15.3 13.7  HCT 43.6 42.4 45.0 41.3  MCV 91.4 91.4  --  93.2  PLT 117* 144*  --  185    Basic Metabolic Panel: Recent Labs  Lab 06/02/18 1744 06/06/18 1836 06/07/18 1807 06/08/18 0406  NA 133* 135 135 136  K 3.7 3.7 3.6 3.5  CL 98 99  --  101  CO2 22 21*  --  23  GLUCOSE 138* 139*  --  150*  BUN 14 16  --  20  CREATININE 0.76 0.75  --  1.15  CALCIUM 8.5* 8.4*  --  8.4*  MG  --   --   --  2.3  PHOS  --   --   --  2.6   GFR: Estimated Creatinine Clearance: 80.3 mL/min (by C-G formula based on SCr of 1.15 mg/dL). Recent Labs  Lab 06/02/18 1744 06/06/18 1836 06/06/18 1838 06/06/18 1842 06/08/18 0406  PROCALCITON  --   --   --  <0.10  --   WBC 5.3 6.1  --   --  10.6*  LATICACIDVEN 1.4  --  1.1  --   --     Liver Function Tests: Recent Labs  Lab 06/06/18 1836 06/08/18 0406  AST 46* 54*  ALT 32 37  ALKPHOS 57 47  BILITOT 1.4* 0.8  PROT 7.8 6.6  ALBUMIN 4.1 3.0*   No results for input(s): LIPASE, AMYLASE in the last 168 hours. No results for input(s): AMMONIA in the last 168 hours.  ABG    Component Value Date/Time   PHART 7.301 (L) 06/07/2018 1807   PCO2ART 48.4 (H) 06/07/2018 1807   PO2ART 234.0 (H) 06/07/2018 1807   HCO3 23.7 06/07/2018 1807   TCO2 25 06/07/2018 1807   ACIDBASEDEF 3.0 (H) 06/07/2018 1807   O2SAT 100.0 06/07/2018 1807     Coagulation Profile: No results for input(s): INR, PROTIME in the last 168 hours.  Cardiac Enzymes: Recent Labs  Lab 06/06/18 1836 06/06/18 1842 06/08/18 0406  CKTOTAL  --  110 160  TROPONINI <0.03  --   --     HbA1C: No results found for: HGBA1C  CBG: Recent Labs  Lab 06/07/18 1832 06/07/18 2022 06/07/18 2354 06/08/18 0354 06/08/18 0827  GLUCAP 231* 211* 138* 135* 151*     App cct 45 min   Brett Canales Amay Mijangos ACNP [facility] PCCM Pager [phone] till 1 pm If no answer page [phone] 06/08/2018, 9:24 AM

## 2018-06-08 NOTE — Progress Notes (Signed)
Initial Nutrition Assessment RD working remotely.  DOCUMENTATION CODES:   Obesity unspecified  INTERVENTION:    Vital High Protein at 25 ml/h (600 ml per day)  Pro-stat 90 ml BID  Provides 1200 kcal, (1963 kcal total with Propofol) 143 gm protein, 502 ml free water daily  NUTRITION DIAGNOSIS:   Inadequate oral intake related to inability to eat as evidenced by NPO status.  GOAL:   Provide needs based on ASPEN/SCCM guidelines  MONITOR:   Vent status, TF tolerance, Skin, Labs, I & O's  REASON FOR ASSESSMENT:   Ventilator, Consult Enteral/tube feeding initiation and management  ASSESSMENT:   58 yo male with PMH of DM and COVID-19 (dx on 4/3) who was transferred from [facility] ED with fever, cough, SOB, and diarrhea. Required intubation on 4/8.  Received MD Consult for TF initiation and management.  Patient is currently intubated on ventilator support MV: 12.2 L/min Temp (24hrs), Avg:99.9 F (37.7 C), Min:98.3 F (36.8 C), Max:102 F (38.9 C)  Propofol: 28.9 ml/hr providing 763 kcal from lipid   Labs reviewed. CBG's: 135-151 Medications reviewed and include plaquenil, novolog, zinc, propofol.   NUTRITION - FOCUSED PHYSICAL EXAM:  unable to assess  Diet Order:   Diet Order            Diet NPO time specified Except for: Sips with Meds  Diet effective now              EDUCATION NEEDS:   No education needs have been identified at this time  Skin:  Skin Assessment: Reviewed RN Assessment  Last BM:  4/8  Height:   Ht Readings from Last 1 Encounters:  06/07/18 5\' 9"  (1.753 m)    Weight:   Wt Readings from Last 1 Encounters:  06/07/18 96.6 kg    Ideal Body Weight:  72.7 kg  BMI:  Body mass index is 31.45 kg/m.  Estimated Nutritional Needs:   Kcal:  1100-1650  Protein:  145 gm  Fluid:  2 L    Joaquin Courts, RD, LDN, CNSC Pager [phone] After Hours Pager [phone]

## 2018-06-08 NOTE — Progress Notes (Deleted)
Inpatient Diabetes Program Recommendations  AACE/ADA: New Consensus Statement on Inpatient Glycemic Control (2015)  Target Ranges:  Prepandial:   less than 140 mg/dL      Peak postprandial:   less than 180 mg/dL (1-2 hours)      Critically ill patients:  140 - 180 mg/dL   Lab Results  Component Value Date   GLUCAP 193 (H) 06/08/2018    Review of Glycemic Control Results for NASER, FALARDEAU (MRN [id number]) as of 06/08/2018 12:18  Ref. Range 06/08/2018 03:54 06/08/2018 08:27 06/08/2018 11:37  Glucose-Capillary Latest Ref Range: 70 - 99 mg/dL 329 (H) 191 (H) 660 (H)   Diabetes history: Type 2 DM Outpatient Diabetes medications: Glipizide 5 mg BID, Metformin 1000 mg BID Current orders for Inpatient glycemic control: Novolog 0-15 units Q4H Vital @ 67ml/hr  Inpatient Diabetes Program Recommendations:    No recent A1C noted, consider repeating if appropriate.   Given addition of tube feeds, consider adding tube feed coverage: Novolog 3 units Q4H.   Would consider starting Levemir 8 units QHS.   Thanks, Lujean Rave, MSN, RNC-OB Diabetes Coordinator [phone] (8a-5p)

## 2018-06-09 ENCOUNTER — Inpatient Hospital Stay (HOSPITAL_COMMUNITY): Payer: 59

## 2018-06-09 DIAGNOSIS — J9601 Acute respiratory failure with hypoxia: Secondary | ICD-10-CM

## 2018-06-09 LAB — CBC WITH DIFFERENTIAL/PLATELET
Abs Immature Granulocytes: 0.04 10*3/uL (ref 0.00–0.07)
Basophils Absolute: 0 10*3/uL (ref 0.0–0.1)
Basophils Relative: 0 %
Eosinophils Absolute: 0 10*3/uL (ref 0.0–0.5)
Eosinophils Relative: 0 %
HCT: 38.3 % — ABNORMAL LOW (ref 39.0–52.0)
Hemoglobin: 13.2 g/dL (ref 13.0–17.0)
Immature Granulocytes: 1 %
Lymphocytes Relative: 11 %
Lymphs Abs: 0.9 10*3/uL (ref 0.7–4.0)
MCH: 32.3 pg (ref 26.0–34.0)
MCHC: 34.5 g/dL (ref 30.0–36.0)
MCV: 93.6 fL (ref 80.0–100.0)
Monocytes Absolute: 0.3 10*3/uL (ref 0.1–1.0)
Monocytes Relative: 4 %
Neutro Abs: 6.7 10*3/uL (ref 1.7–7.7)
Neutrophils Relative %: 84 %
Platelets: 198 10*3/uL (ref 150–400)
RBC: 4.09 MIL/uL — ABNORMAL LOW (ref 4.22–5.81)
RDW: 13.1 % (ref 11.5–15.5)
Smear Review: ADEQUATE
WBC: 8 10*3/uL (ref 4.0–10.5)
nRBC: 0 % (ref 0.0–0.2)

## 2018-06-09 LAB — GLUCOSE, CAPILLARY
Glucose-Capillary: 193 mg/dL — ABNORMAL HIGH (ref 70–99)
Glucose-Capillary: 215 mg/dL — ABNORMAL HIGH (ref 70–99)
Glucose-Capillary: 216 mg/dL — ABNORMAL HIGH (ref 70–99)
Glucose-Capillary: 230 mg/dL — ABNORMAL HIGH (ref 70–99)
Glucose-Capillary: 233 mg/dL — ABNORMAL HIGH (ref 70–99)
Glucose-Capillary: 260 mg/dL — ABNORMAL HIGH (ref 70–99)

## 2018-06-09 LAB — COMPREHENSIVE METABOLIC PANEL
ALT: 40 U/L (ref 0–44)
AST: 50 U/L — ABNORMAL HIGH (ref 15–41)
Albumin: 2.6 g/dL — ABNORMAL LOW (ref 3.5–5.0)
Alkaline Phosphatase: 48 U/L (ref 38–126)
Anion gap: 10 (ref 5–15)
BUN: 34 mg/dL — ABNORMAL HIGH (ref 6–20)
CO2: 25 mmol/L (ref 22–32)
Calcium: 8.2 mg/dL — ABNORMAL LOW (ref 8.9–10.3)
Chloride: 104 mmol/L (ref 98–111)
Creatinine, Ser: 1.39 mg/dL — ABNORMAL HIGH (ref 0.61–1.24)
GFR calc Af Amer: >60 mL/min (ref >60)
GFR calc non Af Amer: 55 mL/min — ABNORMAL LOW (ref >60)
Glucose, Bld: 226 mg/dL — ABNORMAL HIGH (ref 70–99)
Potassium: 4.6 mmol/L (ref 3.5–5.1)
Sodium: 139 mmol/L (ref 135–145)
Total Bilirubin: 0.9 mg/dL (ref 0.3–1.2)
Total Protein: 5.4 g/dL — ABNORMAL LOW (ref 6.5–8.1)

## 2018-06-09 LAB — MAGNESIUM: Magnesium: 2.6 mg/dL — ABNORMAL HIGH (ref 1.7–2.4)

## 2018-06-09 LAB — C-REACTIVE PROTEIN: CRP: 17.6 mg/dL — ABNORMAL HIGH (ref <1.0)

## 2018-06-09 LAB — CK: Total CK: 127 U/L (ref 49–397)

## 2018-06-09 LAB — PHOSPHORUS: Phosphorus: 3.1 mg/dL (ref 2.5–4.6)

## 2018-06-09 MED ORDER — INSULIN GLARGINE 100 UNIT/ML ~~LOC~~ SOLN
7.0000 [IU] | Freq: Every day | SUBCUTANEOUS | Status: DC
  Administered 2018-06-10: 7 [IU] via SUBCUTANEOUS
  Filled 2018-06-09 (×2): qty 0.07

## 2018-06-09 MED ORDER — ZINC SULFATE 220 (50 ZN) MG PO CAPS
220.0000 mg | ORAL_CAPSULE | Freq: Two times a day (BID) | ORAL | Status: AC
  Administered 2018-06-09 – 2018-06-11 (×4): 220 mg via ORAL
  Filled 2018-06-09 (×5): qty 1

## 2018-06-09 MED ORDER — MIDAZOLAM HCL 2 MG/2ML IJ SOLN
INTRAMUSCULAR | Status: AC
  Filled 2018-06-09: qty 2

## 2018-06-09 MED ORDER — OSMOLITE 1.2 CAL PO LIQD
1000.0000 mL | ORAL | Status: DC
  Administered 2018-06-10: 1000 mL
  Filled 2018-06-09 (×4): qty 1000

## 2018-06-09 MED ORDER — INSULIN GLARGINE 100 UNIT/ML ~~LOC~~ SOLN
2.0000 [IU] | Freq: Once | SUBCUTANEOUS | Status: AC
  Administered 2018-06-09: 13:00:00 2 [IU] via SUBCUTANEOUS
  Filled 2018-06-09 (×2): qty 0.02

## 2018-06-09 NOTE — Progress Notes (Addendum)
NAME:  Oneida AlarDanny Masci, MRN:  [id number], DOB:  09/21/1960, LOS: 2 ADMISSION DATE:  06/07/2018, CONSULTATION DATE:  06/07/2018 REFERRING MD:  [name], CHIEF COMPLAINT:  Worsening hypoxemia   Brief History   [age] year male with PMH of DM who was diagnosed with COVID-19 on 4/3 who [facility] with fever, cough, SOB and diarrhea.  [facility] discussed with ID and hydroxychloroquine was started and [facility].  Patient was placed on 4L [place] and [facility].  PCCM was consulted on 4/8 for worsening hypoxemia.  Patient evidently finished a course of tamiflu, zithromax and cefdinir as outpatient with no improvement.  Started on plaquenil, rocephin and zithromax again in the hospital upon admission at 1 AM on 4/8.  History of present illness   [age] year male with PMH of DM who was diagnosed with COVID-19 on 4/3 who [facility] with fever, cough, SOB and diarrhea.  [facility] with [facility] and hydroxychloroquine was started and [facility].  Patient was placed on 4L [place] and [facility].  PCCM was consulted on 4/8 for worsening hypoxemia.  Patient evidently finished a course of tamiflu, zithromax and cefdinir as outpatient with no improvement.  Started on plaquenil, rocephin and zithromax again in the hospital upon admission at 1 AM on 4/8.  Past Medical History  DM  Significant Hospital Events    06/07/2018 decompensated requiring intubation  Consults:  PCCM  Procedures:  N/A  Significant Diagnostic Tests:  CXR that I reviewed myself that shows progressive worsening of bilateral infiltrate 06/08/2018 chest x-ray with bilateral airspace disease lines and tubes around the 06/09/2018: Persistent bilateral hazy/patchy pulmonary infiltrates, with some worsening at both lung bases.  Micro Data:  MRSA 4/8 negative COVID 4/3 positive  Antimicrobials:  Plaquenil 4/7>>> Rocephin 4/7>>> Zithromax 4/7>>>    Interim history/subjective:  Stable on vent. Oxygenating well. No issues overnight.   Objective   Blood pressure 109/72, pulse 71, temperature 98.4 F (36.9 C), temperature source Axillary, resp. rate (!) 30, height 5\' 9"  (1.753 m), weight 96.1 kg, SpO2 100 %.    Vent Mode: PRVC FiO2 (%):  [40 %-70 %] 40 % Set Rate:  [28 bmp] 28 bmp Vt Set:  [420 mL] 420 mL PEEP:  [15 cmH20] 15 cmH20 Plateau Pressure:  [24 cmH20-28 cmH20] 24 cmH20   Intake/Output Summary (Last 24 hours) at 06/09/2018 1038 Last data filed at 06/09/2018 0800 Gross per 24 hour  Intake 2068.13 ml  Output 1500 ml  Net 568.13 ml   Filed Weights   06/07/18 0045 06/09/18 0444  Weight: 96.6 kg 96.1 kg    Examination: General: [age] year old who decompensated on 06/07/2018 requiring intubation currently heavily sedated on full mechanical ventilatory support HEENT: Endotracheal tube is in place, gastric tube in place Neuro: Currently heavily sedated with propofol CV: S1, S2, RRR, No RMG  PULM: Bilateral chest excursion,Decreased breath sounds throughout. Pressure regulated volume control with a rate of 28 and not overbreathing at this time.  PEEP of 15 with O2 saturations of 97% .  PF ratio>> 715 NW:GNFAGI:soft, non-tender, bsx4 active  Extremities: warm/dry, negative edema  Skin: no rashes or lesions   Resolved Hospital Problem list     Assessment & Plan:  Acute hypoxemic respiratory failure: due to COVID-19.  Hypoxemia No Blood Gas 4/10 + 150 last 24 Plan ARDS protocol ventilator  6 cc TV Zithromax Plaquenil CXR daily ABG  4/11 am and prn Ventilator adjustments as needed Sedation as needed to maintain ventilator tolerated  Renal Creatinine uptrending Adequate UO Plan Trend BMET Monitor UO Avoid nephro toxic medications Maintain renal perfusion Hold diuresis   ID Afebrile WBC 8 Currently on Plaquenil  Rocephin ,Zithromax  Blood Cultures and Sputum Cultures pending CRP down trending Plan Follow Micro  Consider decreasing Azithro to 5 day total and discontinuing Rocephin  Re-culture as is clinically indicated  Cardiac Hemodynamically stable No pressor requirements Plan Trend VS Supportive care  DM: CBG (last 3)  Recent Labs    06/08/18 2115 06/09/18 0050 06/09/18 0323  GLUCAP 193* 216* 233*  Lantus 5 units started 4/9 A1C >> 13 Poor control Moderate SSI Slightly worsening renal function Plan: Will increase to 7 units 4/10 Sliding-scale insulin protocol Nutrition consult CBG's Q 4  GI: No Acute Issues Plan Continue TF   Labs   CBC: Recent Labs  Lab 06/02/18 1744 06/06/18 1836 06/07/18 1807 06/08/18 0406 06/08/18 1059 06/09/18 0236  WBC 5.3 6.1  --  10.6*  --  8.0  NEUTROABS 3.3 5.0  --  9.3*  --  6.7  HGB 15.1 14.6 15.3 13.7 12.9* 13.2  HCT 43.6 42.4 45.0 41.3 38.0* 38.3*  MCV 91.4 91.4  --  93.2  --  93.6  PLT 117* 144*  --  185  --  198    Basic Metabolic Panel: Recent Labs  Lab 06/02/18 1744 06/06/18 1836 06/07/18 1807 06/08/18 0406 06/08/18 1059 06/09/18 0236  NA 133* 135 135 136 137 139  K 3.7 3.7 3.6 3.5 3.6 4.6  CL 98 99  --  101  --  104  CO2 22 21*  --  23  --  25  GLUCOSE 138* 139*  --  150*  --  226*  BUN 14 16  --  20  --  34*  CREATININE 0.76 0.75  --  1.15  --  1.39*  CALCIUM 8.5* 8.4*  --  8.4*  --  8.2*  MG  --   --   --  2.3  --  2.6*  PHOS  --   --   --  2.6  --  3.1   GFR: Estimated Creatinine Clearance: 66.3 mL/min (A) (by C-G formula based on SCr of 1.39 mg/dL (H)). Recent Labs  Lab 06/02/18 1744 06/06/18 1836 06/06/18 1838 06/06/18 1842 06/08/18 0406 06/09/18 0236  PROCALCITON  --   --   --  <0.10  --   --   WBC 5.3 6.1  --   --  10.6* 8.0  LATICACIDVEN 1.4  --  1.1  --   --   --     Liver Function Tests: Recent Labs  Lab 06/06/18 1836 06/08/18 0406 06/09/18 0236  AST 46* 54* 50*  ALT 32 37 40  ALKPHOS 57 47 48  BILITOT 1.4* 0.8 0.9  PROT 7.8 6.6 5.4*  ALBUMIN 4.1 3.0* 2.6*   No results for  input(s): LIPASE, AMYLASE in the last 168 hours. No results for input(s): AMMONIA in the last 168 hours.  ABG    Component Value Date/Time   PHART 7.384 06/08/2018 1059   PCO2ART 43.5 06/08/2018 1059   PO2ART 286.0 (H) 06/08/2018 1059   HCO3 26.0 06/08/2018 1059   TCO2 27 06/08/2018 1059   ACIDBASEDEF 3.0 (H) 06/07/2018 1807   O2SAT 100.0 06/08/2018 1059     Coagulation Profile: No results for input(s): INR, PROTIME in the last 168 hours.  Cardiac Enzymes: Recent Labs  Lab 06/06/18 1836 06/06/18 1842 06/08/18 0406 06/09/18 0236  CKTOTAL  --  110 160 127  TROPONINI <0.03  --   --   --     HbA1C: No results found for: HGBA1C  CBG: Recent Labs  Lab 06/08/18 1137 06/08/18 1554 06/08/18 2115 06/09/18 0050 06/09/18 0323  GLUCAP 193* 227* 193* 216* 233*     App cct 40 minutes   Bevelyn Ngo, AGACNP-BC [facility] PCCM Pager [phone] till 1 pm If no answer page [phone] 06/09/2018, 10:38 AM   ___________________________________________________________________________  PCCM Attending:   58 yo, COVID19 ARDS, intubated on MV    BP 109/70   Pulse 78   Temp 98.4 F (36.9 C) (Axillary)   Resp (!) 28   Ht  (1.753 m)   Wt 96.1 kg   SpO2 94%   BMI 31.29 kg/m   Gen: middle aged male, intubated on vent, appears comfortable  Lungs: BL vented breaths, vent waves reviewed  Cardiac: sinus on tele, reviewed  CXR: reviewed, BL infiltrates - The patient's images have been independently reviewed by me.    A: AHRF on MV  ARDS COVID19 PNA   P: Weaning vent, P:F improved decrease FiO2  ARDS protocol  Repeat ABG tomorrow  On plaq  azithro for 5 days  If decompensates low threshold to prone early   This patient is critically ill with multiple organ system failure; which, requires frequent high complexity decision making, assessment, support, evaluation, and titration of therapies. This was completed through the application of advanced monitoring  technologies and extensive interpretation of multiple databases. During this encounter critical care time was devoted to patient care services described in this note for 34 minutes.   Josephine Igo, DO [facility] 06/09/2018 5:17 PM  Personal pager: [phone] If unanswered, please page CCM On-call: #[phone]

## 2018-06-09 NOTE — Progress Notes (Addendum)
Nutrition Follow-up RD working remotely.  DOCUMENTATION CODES:   Obesity unspecified  INTERVENTION:   Continue TF via OGT:  Change to Osmolite 1.2 at 40 ml/h (960 ml per day)  Pro-stat 90 ml BID  Provides 1752 kcal (2211 kcal total with Propofol), 143 gm protein, 787ml free water daily  NUTRITION DIAGNOSIS:   Inadequate oral intake related to inability to eat as evidenced by NPO status.  Ongoing   GOAL:   Provide needs based on ASPEN/SCCM guidelines  Met with TF  MONITOR:   Vent status, TF tolerance, Skin, Labs, I & O's  ASSESSMENT:   58 yo male with PMH of DM and COVID-19 (dx on 4/3) who was transferred from [facility] ED with fever, cough, SOB, and diarrhea. Required intubation on 4/8.  Patient remains intubated on ventilator support.   Temp (24hrs), Avg:98.3 F (36.8 C), Min:98.1 F (36.7 C), Max:98.5 F (36.9 C)  Propofol: 17.4 ml/h providing 459 kcal from lipid  Labs reviewed.  CBG's: 216-233  Medications reviewed and include novolog, lantus, zinc, plaquenil.   NUTRITION - FOCUSED PHYSICAL EXAM:  unable to complete  Diet Order:   Diet Order            Diet NPO time specified Except for: Sips with Meds  Diet effective now              EDUCATION NEEDS:   No education needs have been identified at this time  Skin:  Skin Assessment: Skin Integrity Issues: Skin Integrity Issues:: Stage I Stage I: buttocks  Last BM:  4/10  Height:   Ht Readings from Last 1 Encounters:  06/07/18 5' 9" (1.753 m)    Weight:   Wt Readings from Last 1 Encounters:  06/09/18 96.1 kg    Ideal Body Weight:  72.7 kg  BMI:  Body mass index is 31.29 kg/m.  Estimated Nutritional Needs:   Kcal:  1100-1650  Protein:  145 gm  Fluid:  2 L    Kimberly Harris, RD, LDN, CNSC Pager [phone] After Hours Pager [phone]  

## 2018-06-09 NOTE — Progress Notes (Signed)
Inpatient Diabetes Program Recommendations  AACE/ADA: New Consensus Statement on Inpatient Glycemic Control (2015)  Target Ranges:  Prepandial:   less than 140 mg/dL      Peak postprandial:   less than 180 mg/dL (1-2 hours)      Critically ill patients:  140 - 180 mg/dL   Lab Results  Component Value Date   GLUCAP 233 (H) 06/09/2018    Review of Glycemic Control Results for ESTHER, SELLARS (MRN [id number]) as of 06/09/2018 14:36  Ref. Range 06/08/2018 21:15 06/09/2018 00:50 06/09/2018 03:23  Glucose-Capillary Latest Ref Range: 70 - 99 mg/dL 361 (H) 224 (H) 497 (H)   Diabetes history: DM2 Outpatient Diabetes medications:  Glucotrol 5 mg daily, Metformin 53005 mg bid Current orders for Inpatient glycemic control:  Novolog moderate q 4 hours, Lantus 7 units daily Vital 25 cc/hr Inpatient Diabetes Program Recommendations:    Consider increasing Lantus to 15 units daily.  If blood sugars remain increased with increased basal, may also consider adding Novolog tube feed coverage.   Thanks,  Beryl Meager, RN, BC-ADM Inpatient Diabetes Coordinator Pager [phone] (8a-5p)

## 2018-06-10 ENCOUNTER — Inpatient Hospital Stay (HOSPITAL_COMMUNITY): Payer: 59

## 2018-06-10 DIAGNOSIS — Z9911 Dependence on respirator [ventilator] status: Secondary | ICD-10-CM

## 2018-06-10 LAB — COMPREHENSIVE METABOLIC PANEL
ALT: 40 U/L (ref 0–44)
AST: 37 U/L (ref 15–41)
Albumin: 2.6 g/dL — ABNORMAL LOW (ref 3.5–5.0)
Alkaline Phosphatase: 52 U/L (ref 38–126)
Anion gap: 13 (ref 5–15)
BUN: 35 mg/dL — ABNORMAL HIGH (ref 6–20)
CO2: 24 mmol/L (ref 22–32)
Calcium: 8.6 mg/dL — ABNORMAL LOW (ref 8.9–10.3)
Chloride: 106 mmol/L (ref 98–111)
Creatinine, Ser: 1.11 mg/dL (ref 0.61–1.24)
GFR calc Af Amer: >60 mL/min (ref >60)
GFR calc non Af Amer: >60 mL/min (ref >60)
Glucose, Bld: 224 mg/dL — ABNORMAL HIGH (ref 70–99)
Potassium: 3.9 mmol/L (ref 3.5–5.1)
Sodium: 143 mmol/L (ref 135–145)
Total Bilirubin: 0.7 mg/dL (ref 0.3–1.2)
Total Protein: 5.9 g/dL — ABNORMAL LOW (ref 6.5–8.1)

## 2018-06-10 LAB — CBC WITH DIFFERENTIAL/PLATELET
Abs Immature Granulocytes: 0.06 10*3/uL (ref 0.00–0.07)
Basophils Absolute: 0 10*3/uL (ref 0.0–0.1)
Basophils Relative: 0 %
Eosinophils Absolute: 0.1 10*3/uL (ref 0.0–0.5)
Eosinophils Relative: 2 %
HCT: 39.6 % (ref 39.0–52.0)
Hemoglobin: 13.2 g/dL (ref 13.0–17.0)
Immature Granulocytes: 1 %
Lymphocytes Relative: 23 %
Lymphs Abs: 1.1 10*3/uL (ref 0.7–4.0)
MCH: 31.7 pg (ref 26.0–34.0)
MCHC: 33.3 g/dL (ref 30.0–36.0)
MCV: 95.2 fL (ref 80.0–100.0)
Monocytes Absolute: 0.4 10*3/uL (ref 0.1–1.0)
Monocytes Relative: 8 %
Neutro Abs: 3.2 10*3/uL (ref 1.7–7.7)
Neutrophils Relative %: 66 %
Platelets: 231 10*3/uL (ref 150–400)
RBC: 4.16 MIL/uL — ABNORMAL LOW (ref 4.22–5.81)
RDW: 13.3 % (ref 11.5–15.5)
Smear Review: ADEQUATE
WBC: 4.8 10*3/uL (ref 4.0–10.5)
nRBC: 0 % (ref 0.0–0.2)

## 2018-06-10 LAB — TRIGLYCERIDES: Triglycerides: 636 mg/dL — ABNORMAL HIGH (ref <150)

## 2018-06-10 LAB — POCT I-STAT 7, (LYTES, BLD GAS, ICA,H+H)
Acid-Base Excess: 1 mmol/L (ref 0.0–2.0)
Bicarbonate: 27.2 mmol/L (ref 20.0–28.0)
Calcium, Ion: 1.21 mmol/L (ref 1.15–1.40)
HCT: 40 % (ref 39.0–52.0)
Hemoglobin: 13.6 g/dL (ref 13.0–17.0)
O2 Saturation: 96 %
Patient temperature: 98.2
Potassium: 3.6 mmol/L (ref 3.5–5.1)
Sodium: 145 mmol/L (ref 135–145)
TCO2: 29 mmol/L (ref 22–32)
pCO2 arterial: 49 mmHg — ABNORMAL HIGH (ref 32.0–48.0)
pH, Arterial: 7.352 (ref 7.350–7.450)
pO2, Arterial: 88 mmHg (ref 83.0–108.0)

## 2018-06-10 LAB — GLUCOSE, CAPILLARY
Glucose-Capillary: 173 mg/dL — ABNORMAL HIGH (ref 70–99)
Glucose-Capillary: 203 mg/dL — ABNORMAL HIGH (ref 70–99)
Glucose-Capillary: 219 mg/dL — ABNORMAL HIGH (ref 70–99)
Glucose-Capillary: 231 mg/dL — ABNORMAL HIGH (ref 70–99)
Glucose-Capillary: 251 mg/dL — ABNORMAL HIGH (ref 70–99)
Glucose-Capillary: 264 mg/dL — ABNORMAL HIGH (ref 70–99)
Glucose-Capillary: 295 mg/dL — ABNORMAL HIGH (ref 70–99)

## 2018-06-10 LAB — MAGNESIUM: Magnesium: 2.5 mg/dL — ABNORMAL HIGH (ref 1.7–2.4)

## 2018-06-10 LAB — CULTURE, RESPIRATORY W GRAM STAIN: Culture: NORMAL

## 2018-06-10 LAB — C-REACTIVE PROTEIN: CRP: 8.5 mg/dL — ABNORMAL HIGH (ref <1.0)

## 2018-06-10 LAB — CK: Total CK: 69 U/L (ref 49–397)

## 2018-06-10 LAB — CULTURE, RESPIRATORY

## 2018-06-10 MED ORDER — INSULIN GLARGINE 100 UNIT/ML ~~LOC~~ SOLN
10.0000 [IU] | Freq: Every day | SUBCUTANEOUS | Status: DC
  Administered 2018-06-11 – 2018-06-12 (×2): 10 [IU] via SUBCUTANEOUS
  Filled 2018-06-10 (×3): qty 0.1

## 2018-06-10 MED ORDER — DEXMEDETOMIDINE HCL IN NACL 400 MCG/100ML IV SOLN
0.0000 ug/kg/h | INTRAVENOUS | Status: DC
  Administered 2018-06-10: 20:00:00 0.4 ug/kg/h via INTRAVENOUS
  Filled 2018-06-10: qty 100

## 2018-06-10 MED ORDER — FENTANYL BOLUS VIA INFUSION
50.0000 ug | INTRAVENOUS | Status: DC | PRN
  Administered 2018-06-11: 50 ug via INTRAVENOUS
  Filled 2018-06-10: qty 50

## 2018-06-10 MED ORDER — MIDAZOLAM HCL 2 MG/2ML IJ SOLN: 2.0000 mg | INTRAMUSCULAR | Status: DC | PRN

## 2018-06-10 MED ORDER — FENTANYL 2500MCG IN NS 250ML (10MCG/ML) PREMIX INFUSION
25.0000 ug/h | INTRAVENOUS | Status: DC
  Administered 2018-06-10: 50 ug/h via INTRAVENOUS
  Filled 2018-06-10: qty 250

## 2018-06-10 MED ORDER — FENTANYL CITRATE (PF) 100 MCG/2ML IJ SOLN: 100.0000 ug | INTRAMUSCULAR | Status: DC | PRN

## 2018-06-10 MED ORDER — MIDAZOLAM HCL 2 MG/2ML IJ SOLN
2.0000 mg | INTRAMUSCULAR | Status: DC | PRN
  Administered 2018-06-10 – 2018-06-11 (×2): 2 mg via INTRAVENOUS
  Filled 2018-06-10 (×2): qty 2

## 2018-06-10 MED ORDER — FENTANYL CITRATE (PF) 100 MCG/2ML IJ SOLN
50.0000 ug | Freq: Once | INTRAMUSCULAR | Status: AC
  Administered 2018-06-10: 23:00:00 50 ug via INTRAVENOUS

## 2018-06-10 MED ORDER — FUROSEMIDE 10 MG/ML IJ SOLN
20.0000 mg | Freq: Once | INTRAMUSCULAR | Status: AC
  Administered 2018-06-10: 18:00:00 20 mg via INTRAVENOUS
  Filled 2018-06-10: qty 2

## 2018-06-10 NOTE — Progress Notes (Addendum)
NAME:  Adrian Mclean, MRN:  [id number], DOB:  [birth year], LOS: 3 ADMISSION DATE:  06/07/2018, CONSULTATION DATE:  06/07/2018 REFERRING MD:  [name], CHIEF COMPLAINT:  Worsening hypoxemia   Brief History   [age] year male with PMH of DM who was diagnosed with COVID-19 on 4/3 [facility], cough, SOB and diarrhea.  [facility] with ID and hydroxychloroquine was started and [facility].  Patient was placed on 4L [place] and [facility]  PCCM was consulted on 4/8 for worsening hypoxemia.  Patient evidently finished a course of tamiflu, zithromax and cefdinir as outpatient with no improvement.  Started on plaquenil, rocephin and zithromax again in the hospital upon admission at 1 AM on 4/8.  History of present illness   [age] year male with PMH of DM who was diagnosed with COVID-19 on 4/3 [facility], cough, SOB and diarrhea.  [facility] with ID and hydroxychloroquine was started and [facility].  Patient was placed on 4L [place] and [facility]  PCCM was consulted on 4/8 for worsening hypoxemia.  Patient evidently finished a course of tamiflu, zithromax and cefdinir as outpatient with no improvement.  Started on plaquenil, rocephin and zithromax again in the hospital upon admission at 1 AM on 4/8.  Past Medical History  DM  Significant Hospital Events    06/07/2018 decompensated requiring intubation  Consults:  PCCM  Procedures:  N/A  Significant Diagnostic Tests:  CXR that I reviewed myself that shows progressive worsening of bilateral infiltrate 06/08/2018 chest x-ray with bilateral airspace disease lines and tubes around the 06/09/2018: Persistent bilateral hazy/patchy pulmonary infiltrates, with some worsening at both lung bases. 4/11 VHQ:IONGEXBMCXR:Improved aeration in both lungs since yesterday, with only mild hazy and patchy airspace opacities persisting. ETT advanced  3-4 cm on 4/11 0800    Micro Data:  MRSA 4/8 negative COVID 4/3 positive 4/8 Respiratory Culture: Normal Flora 4/7: Blood Cultures>>  Antimicrobials:  Plaquenil 4/7>>> Rocephin 4/7>>> Zithromax 4/7>>>   Interim history/subjective:  Stable on vent. Oxygenating well. Improving CXR ,No issues overnight.   Requiring more sedation CRP down from 17.6 to 8.5 CK Down from 127-69 Calcium corrects to 9.7 Awaiting ABG to determine PF ratio  Objective   Blood pressure 119/70, pulse 80, temperature 98.4 F (36.9 C), temperature source Oral, resp. rate 20, height 5\' 9"  (1.753 m), weight 96.7 kg, SpO2 98 %.    Vent Mode: PRVC FiO2 (%):  [40 %] 40 % Set Rate:  [28 bmp] 28 bmp Vt Set:  [420 mL] 420 mL PEEP:  [15 cmH20] 15 cmH20 Plateau Pressure:  [21 cmH20-25 cmH20] 25 cmH20   Intake/Output Summary (Last 24 hours) at 06/10/2018 1341 Last data filed at 06/10/2018 1300 Gross per 24 hour  Intake 1693.65 ml  Output 2320 ml  Net -626.35 ml   Filed Weights   06/07/18 0045 06/09/18 0444 06/10/18 0300  Weight: 96.6 kg 96.1 kg 96.7 kg    Examination: General: 58 year old who decompensated on 06/07/2018 requiring intubation currently heavily sedated on full mechanical ventilatory support HEENT: Endotracheal tube is in place, gastric tube in place, NCAT, No LAD Neuro: Currently heavily sedated with propofol, opens eyes to call of name and follows some commands CV: S1, S2, RRR, No RMG  PULM: Bilateral chest excursion,Decreased breath sounds throughout. Few rhonchi Pressure regulated volume control with a rate of 28 and not  overbreathing at this time.  PEEP of 15 with O2 saturations of 97% .  PF ratio>> 715 VV:OHYW, non-tender, bsx4 active  Extremities: warm/dry, negative edema  Skin: no rashes or lesions   Resolved Hospital Problem list     Assessment & Plan:  Acute hypoxemic respiratory failure: due to COVID-19.  Hypoxemia No Blood Gas 4/10 Net negative 430'cc last 24 Improving CXR  4/11 Plan ABG now ARDS protocol ventilator  6 cc TV Zithromax Plaquenil CXR daily and prn ABG 4/12 am and prn Ventilator adjustments as needed Sedation as needed to maintain ventilator synchrony Covid biological labs in 4.12  Renal Creatinine with slight down trend Adequate UO, 1200 cc last 24 hours Plan Trend BMET Monitor UO Avoid nephro toxic medications Maintain renal perfusion Hold diuresis for now  ID Afebrile WBC 4.8 Currently on Plaquenil ( Day 4)  Rocephin ( Day 4) ,Zithromax ( Day 4) Blood Cultures and Sputum Cultures pending CRP down trending Plan Follow Micro Consider decreasing Azithro to 5 day total and discontinuing Rocephin  Re-culture as is clinically indicated  Cardiac Hemodynamically stable No pressor requirements Plan Trend VS Supportive care  DM: CBG (last 3)  Recent Labs    06/10/18 0256 06/10/18 0838 06/10/18 1114  GLUCAP 203* 173* 231*  Lantus increased to 7 units 4/11 A1C >> 13 Poor control Moderate SSI Plan: Will increase to 10 units 4/11 Sliding-scale insulin protocol Nutrition consult CBG's Q 4  GI: No Acute Issues Plan Continue TF   Labs   CBC: Recent Labs  Lab 06/06/18 1836 06/07/18 1807 06/08/18 0406 06/08/18 1059 06/09/18 0236 06/10/18 0250  WBC 6.1  --  10.6*  --  8.0 4.8  NEUTROABS 5.0  --  9.3*  --  6.7 3.2  HGB 14.6 15.3 13.7 12.9* 13.2 13.2  HCT 42.4 45.0 41.3 38.0* 38.3* 39.6  MCV 91.4  --  93.2  --  93.6 95.2  PLT 144*  --  185  --  198 231    Basic Metabolic Panel: Recent Labs  Lab 06/06/18 1836 06/07/18 1807 06/08/18 0406 06/08/18 1059 06/09/18 0236 06/10/18 0250  NA 135 135 136 137 139 143  K 3.7 3.6 3.5 3.6 4.6 3.9  CL 99  --  101  --  104 106  CO2 21*  --  23  --  25 24  GLUCOSE 139*  --  150*  --  226* 224*  BUN 16  --  20  --  34* 35*  CREATININE 0.75  --  1.15  --  1.39* 1.11  CALCIUM 8.4*  --  8.4*  --  8.2* 8.6*  MG  --   --  2.3  --  2.6* 2.5*  PHOS  --   --  2.6  --   3.1  --    GFR: Estimated Creatinine Clearance: 83.2 mL/min (by C-G formula based on SCr of 1.11 mg/dL). Recent Labs  Lab 06/06/18 1836 06/06/18 1838 06/06/18 1842 06/08/18 0406 06/09/18 0236 06/10/18 0250  PROCALCITON  --   --  <0.10  --   --   --   WBC 6.1  --   --  10.6* 8.0 4.8  LATICACIDVEN  --  1.1  --   --   --   --     Liver Function Tests: Recent Labs  Lab 06/06/18 1836 06/08/18 0406 06/09/18 0236 06/10/18 0250  AST 46* 54* 50* 37  ALT 32 37 40 40  ALKPHOS 57 47 48 52  BILITOT 1.4* 0.8 0.9 0.7  PROT 7.8 6.6 5.4* 5.9*  ALBUMIN 4.1 3.0* 2.6* 2.6*   No results for input(s): LIPASE, AMYLASE in the last 168 hours. No results for input(s): AMMONIA in the last 168 hours.  ABG    Component Value Date/Time   PHART 7.384 06/08/2018 1059   PCO2ART 43.5 06/08/2018 1059   PO2ART 286.0 (H) 06/08/2018 1059   HCO3 26.0 06/08/2018 1059   TCO2 27 06/08/2018 1059   ACIDBASEDEF 3.0 (H) 06/07/2018 1807   O2SAT 100.0 06/08/2018 1059     Coagulation Profile: No results for input(s): INR, PROTIME in the last 168 hours.  Cardiac Enzymes: Recent Labs  Lab 06/06/18 1836 06/06/18 1842 06/08/18 0406 06/09/18 0236 06/10/18 0250  CKTOTAL  --  110 160 127 69  TROPONINI <0.03  --   --   --   --     HbA1C: No results found for: HGBA1C  CBG: Recent Labs  Lab 06/09/18 2221 06/10/18 0058 06/10/18 0256 06/10/18 0838 06/10/18 1114  GLUCAP 193* 219* 203* 173* 231*     App cct 40 minutes   Bevelyn Ngo, AGACNP-BC [facility] PCCM Pager [phone] till 1 pm If no answer page [phone] 06/10/2018, 1:41 PM   ___________________________________________________________________________   PCCM Attending:   58 yo COVID positive, cxr clear, AHRF, intubated on MV   BP 139/72 (BP Location: Left Arm)   Pulse 79   Temp 98.3 F (36.8 C) (Oral)   Resp (!) 24   Ht  (1.753 m)   Wt 96.7 kg   SpO2 100%   BMI 31.48 kg/m   Gen: middle aged male, intubated on  vent, appears comfortable, no distress  Lungs: BL vented breaths, vent wave forms have been reviewed  Cardiac: sinus on tele   ABG    Component Value Date/Time   PHART 7.352 06/10/2018 1404   PCO2ART 49.0 (H) 06/10/2018 1404   PO2ART 88.0 06/10/2018 1404   HCO3 27.2 06/10/2018 1404   TCO2 29 06/10/2018 1404   ACIDBASEDEF 3.0 (H) 06/07/2018 1807   O2SAT 96.0 06/10/2018 1404    A: AHRF on MV  COVID positive CXR looks much improved   P: On ARDS protocol  Wean FiO2  ABX to complete course  Decrease sedation  Hopeful for extubation tomorrow  PS CPAP trial tomorrow  Discussed with RT   This patient is critically ill with multiple organ system failure; which, requires frequent high complexity decision making, assessment, support, evaluation, and titration of therapies. This was completed through the application of advanced monitoring technologies and extensive interpretation of multiple databases. During this encounter critical care time was devoted to patient care services described in this note for 36 minutes.   Josephine Igo, DO [facility] 06/10/2018 4:18 PM  Personal pager: [phone] If unanswered, please page CCM On-call: #[phone]

## 2018-06-10 NOTE — Progress Notes (Signed)
eLink Physician-Brief Progress Note Patient Name: Baer Snay DOB: April 28, 1960 MRN: [id number]   Date of Service  06/10/2018  HPI/Events of Note  TG level up to 636 on propofol gtt for sedation.  eICU Interventions  Plan: D/C propofol Use precedex for cont sedation PRN versed/fentanyl Continue to monitor patient     Intervention Category Major Interventions: Other:  Taquan Bralley 06/10/2018, 7:51 PM

## 2018-06-11 ENCOUNTER — Inpatient Hospital Stay (HOSPITAL_COMMUNITY): Payer: 59

## 2018-06-11 ENCOUNTER — Other Ambulatory Visit: Payer: Self-pay

## 2018-06-11 DIAGNOSIS — E8770 Fluid overload, unspecified: Secondary | ICD-10-CM

## 2018-06-11 LAB — LACTATE DEHYDROGENASE: LDH: 371 U/L — ABNORMAL HIGH (ref 98–192)

## 2018-06-11 LAB — CBC WITH DIFFERENTIAL/PLATELET
Abs Immature Granulocytes: 0.07 10*3/uL (ref 0.00–0.07)
Basophils Absolute: 0 10*3/uL (ref 0.0–0.1)
Basophils Relative: 0 %
Eosinophils Absolute: 0.1 10*3/uL (ref 0.0–0.5)
Eosinophils Relative: 1 %
HCT: 37.6 % — ABNORMAL LOW (ref 39.0–52.0)
Hemoglobin: 12.7 g/dL — ABNORMAL LOW (ref 13.0–17.0)
Immature Granulocytes: 1 %
Lymphocytes Relative: 17 %
Lymphs Abs: 1 10*3/uL (ref 0.7–4.0)
MCH: 32.2 pg (ref 26.0–34.0)
MCHC: 33.8 g/dL (ref 30.0–36.0)
MCV: 95.2 fL (ref 80.0–100.0)
Monocytes Absolute: 0.5 10*3/uL (ref 0.1–1.0)
Monocytes Relative: 8 %
Neutro Abs: 4 10*3/uL (ref 1.7–7.7)
Neutrophils Relative %: 73 %
Platelets: 252 10*3/uL (ref 150–400)
RBC: 3.95 MIL/uL — ABNORMAL LOW (ref 4.22–5.81)
RDW: 13.2 % (ref 11.5–15.5)
WBC: 5.6 10*3/uL (ref 4.0–10.5)
nRBC: 0 % (ref 0.0–0.2)

## 2018-06-11 LAB — C-REACTIVE PROTEIN: CRP: 3.1 mg/dL — ABNORMAL HIGH (ref <1.0)

## 2018-06-11 LAB — COMPREHENSIVE METABOLIC PANEL
ALT: 74 U/L — ABNORMAL HIGH (ref 0–44)
AST: 77 U/L — ABNORMAL HIGH (ref 15–41)
Albumin: 2.7 g/dL — ABNORMAL LOW (ref 3.5–5.0)
Alkaline Phosphatase: 63 U/L (ref 38–126)
Anion gap: 14 (ref 5–15)
BUN: 39 mg/dL — ABNORMAL HIGH (ref 6–20)
CO2: 25 mmol/L (ref 22–32)
Calcium: 8.5 mg/dL — ABNORMAL LOW (ref 8.9–10.3)
Chloride: 105 mmol/L (ref 98–111)
Creatinine, Ser: 1.18 mg/dL (ref 0.61–1.24)
GFR calc Af Amer: >60 mL/min (ref >60)
GFR calc non Af Amer: >60 mL/min (ref >60)
Glucose, Bld: 324 mg/dL — ABNORMAL HIGH (ref 70–99)
Potassium: 3.8 mmol/L (ref 3.5–5.1)
Sodium: 144 mmol/L (ref 135–145)
Total Bilirubin: 0.8 mg/dL (ref 0.3–1.2)
Total Protein: 5.3 g/dL — ABNORMAL LOW (ref 6.5–8.1)

## 2018-06-11 LAB — POCT I-STAT 7, (LYTES, BLD GAS, ICA,H+H)
Acid-Base Excess: 1 mmol/L (ref 0.0–2.0)
Bicarbonate: 25.3 mmol/L (ref 20.0–28.0)
Calcium, Ion: 1.14 mmol/L — ABNORMAL LOW (ref 1.15–1.40)
HCT: 33 % — ABNORMAL LOW (ref 39.0–52.0)
Hemoglobin: 11.2 g/dL — ABNORMAL LOW (ref 13.0–17.0)
O2 Saturation: 92 %
Patient temperature: 37.6
Potassium: 2.9 mmol/L — ABNORMAL LOW (ref 3.5–5.1)
Sodium: 149 mmol/L — ABNORMAL HIGH (ref 135–145)
TCO2: 26 mmol/L (ref 22–32)
pCO2 arterial: 39.7 mmHg (ref 32.0–48.0)
pH, Arterial: 7.415 (ref 7.350–7.450)
pO2, Arterial: 64 mmHg — ABNORMAL LOW (ref 83.0–108.0)

## 2018-06-11 LAB — PHOSPHORUS
Phosphorus: 3 mg/dL (ref 2.5–4.6)
Phosphorus: 2.2 mg/dL — ABNORMAL LOW (ref 2.5–4.6)

## 2018-06-11 LAB — GLUCOSE, CAPILLARY
Glucose-Capillary: 298 mg/dL — ABNORMAL HIGH (ref 70–99)
Glucose-Capillary: 253 mg/dL — ABNORMAL HIGH (ref 70–99)
Glucose-Capillary: 230 mg/dL — ABNORMAL HIGH (ref 70–99)
Glucose-Capillary: 269 mg/dL — ABNORMAL HIGH (ref 70–99)
Glucose-Capillary: 246 mg/dL — ABNORMAL HIGH (ref 70–99)

## 2018-06-11 LAB — CULTURE, BLOOD (ROUTINE X 2)
Culture: NO GROWTH
Culture: NO GROWTH
Special Requests: ADEQUATE

## 2018-06-11 LAB — BASIC METABOLIC PANEL
Anion gap: 10 (ref 5–15)
BUN: 35 mg/dL — ABNORMAL HIGH (ref 6–20)
CO2: 26 mmol/L (ref 22–32)
Calcium: 8.4 mg/dL — ABNORMAL LOW (ref 8.9–10.3)
Chloride: 111 mmol/L (ref 98–111)
Creatinine, Ser: 1.02 mg/dL (ref 0.61–1.24)
GFR calc Af Amer: >60 mL/min (ref >60)
GFR calc non Af Amer: >60 mL/min (ref >60)
Glucose, Bld: 284 mg/dL — ABNORMAL HIGH (ref 70–99)
Potassium: 3.4 mmol/L — ABNORMAL LOW (ref 3.5–5.1)
Sodium: 147 mmol/L — ABNORMAL HIGH (ref 135–145)

## 2018-06-11 LAB — D-DIMER, QUANTITATIVE: D-Dimer, Quant: 1.35 ug/mL-FEU — ABNORMAL HIGH (ref 0.00–0.50)

## 2018-06-11 LAB — CK: Total CK: 108 U/L (ref 49–397)

## 2018-06-11 LAB — MAGNESIUM
Magnesium: 2.5 mg/dL — ABNORMAL HIGH (ref 1.7–2.4)
Magnesium: 2.5 mg/dL — ABNORMAL HIGH (ref 1.7–2.4)

## 2018-06-11 LAB — FERRITIN: Ferritin: 954 ng/mL — ABNORMAL HIGH (ref 24–336)

## 2018-06-11 MED ORDER — POTASSIUM CHLORIDE 10 MEQ/50ML IV SOLN
10.0000 meq | INTRAVENOUS | Status: AC
  Administered 2018-06-11 (×6): 10 meq via INTRAVENOUS
  Filled 2018-06-11 (×6): qty 50

## 2018-06-11 MED ORDER — FUROSEMIDE 10 MG/ML IJ SOLN
40.0000 mg | Freq: Four times a day (QID) | INTRAMUSCULAR | Status: AC
  Administered 2018-06-11 (×2): 40 mg via INTRAVENOUS
  Filled 2018-06-11 (×2): qty 4

## 2018-06-11 NOTE — Progress Notes (Signed)
SLP Cancellation Note  Patient Details Name: Adrian Mclean MRN: [id number] DOB: January 05, 1961   Cancelled treatment:       Reason Eval/Treat Not Completed: Medical issues which prohibited therapy. SLP received swallow orders. Note pt just extubated and is currently on non-rebreather mask. Will follow up next date to determine whether SLP swallow evaluation appropriate.  Rondel Baton, [place], CCC-SLP Speech-Language Pathologist Acute Rehabilitation Services Pager: [phone] Office: [phone]    Arlana Lindau 06/11/2018, 12:57 PM

## 2018-06-11 NOTE — Procedures (Signed)
Extubation Procedure Note  Patient Details:   Name: Brenson Perkinson DOB: [birth year] MRN: [id number]   Airway Documentation:    Vent end date: 06/11/18 Vent end time: 1127   Evaluation  O2 sats: stable throughout Complications: No apparent complications Patient did tolerate procedure well. Bilateral Breath Sounds: Diminished   Yes   Patient extubated per order to 6L [place] with no apparent complications. Positive cuff leak was noted prior to extubation. Patient is alert and is able to weakly speak. Vitals are stable. RT will continue to monitor.   Tamara Monteith Lajuana Ripple 06/11/2018, 11:31 AM

## 2018-06-11 NOTE — Progress Notes (Signed)
RT NOTE: RT placed patient on NRB at 15L due to drop in sats into mid 80's. RT tried venti mask at 55% but patient could still not maintain sats. Vitals are stable and sats are 95%. MD is aware. RT will continue to monitor.

## 2018-06-11 NOTE — Progress Notes (Signed)
Assisted tele visit to patient with wife.  Billi Bright M, RN  

## 2018-06-11 NOTE — Progress Notes (Addendum)
NAME:  Adrian Mclean, MRN:  [id number], DOB:  16-May-1960, LOS: 4 ADMISSION DATE:  06/07/2018, CONSULTATION DATE:  06/07/2018 REFERRING MD:  Darcel Smalling Marland Mcalpine, CHIEF COMPLAINT:  Worsening hypoxemia   Brief History   58 year male with PMH of DM who was diagnosed with COVID-19 on 4/3 who presented to [facility] with fever, cough, SOB and diarrhea.  EDP in [facility] discussed with ID and hydroxychloroquine was started and patient was transferred to [facility] for admission.  Patient was placed on 4L [place] and transferred to [facility].  PCCM was consulted on 4/8 for worsening hypoxemia.  Patient evidently finished a course of tamiflu, zithromax and cefdinir as outpatient with no improvement.  Started on plaquenil, rocephin and zithromax again in the hospital upon admission at 1 AM on 4/8.  History of present illness   58 year male with PMH of DM who was diagnosed with COVID-19 on 4/3 who presented to [facility] with fever, cough, SOB and diarrhea.  EDP in [facility] discussed with ID and hydroxychloroquine was started and patient was transferred to [facility] for admission.  Patient was placed on 4L [place] and transferred to [facility].  PCCM was consulted on 4/8 for worsening hypoxemia.  Patient evidently finished a course of tamiflu, zithromax and cefdinir as outpatient with no improvement.  Started on plaquenil, rocephin and zithromax again in the hospital upon admission at 1 AM on 4/8.  Past Medical History  DM  Significant Hospital Events    06/07/2018 decompensated requiring intubation  Consults:  PCCM  Procedures:  N/A  Significant Diagnostic Tests:  CXR that I reviewed myself that shows progressive worsening of bilateral infiltrate 06/08/2018 chest x-ray with bilateral airspace disease lines and tubes around the 06/09/2018: Persistent bilateral hazy/patchy pulmonary infiltrates, with some worsening at both lung bases. 4/11 HTM:BPJPETKK aeration in both lungs since yesterday, with only mild hazy and patchy airspace opacities persisting. ETT advanced  3-4 cm on 4/11 0800 CXR 4/12>> Significant worsening of extensive patchy opacity throughout the bilateral mid to lower lungs compatible with viral pneumonia.    Micro Data:  MRSA 4/8 negative COVID 4/3 positive 4/8 Respiratory Culture: Normal Flora 4/7: Blood Cultures>>No Growth  Antimicrobials:  Plaquenil 4/7>>> Rocephin 4/7>>> Zithromax 4/7>>>   Interim history/subjective:  Stable on vent.  Oxygenating well.  Worsening  CXR ,  Triglycerides elevated >> No more precedex CRP down from 17.6 to 8.5>> 3.1 CK 108 Calcium corrects to 9.5   Objective   Blood pressure (!) 147/72, pulse 87, temperature 98.3 F (36.8 C), temperature source Oral, resp. rate 15, height 5\' 9"  (1.753 m), weight 96.5 kg, SpO2 97 %.    Vent Mode: PSV;CPAP FiO2 (%):  [40 %] 40 % Set Rate:  [28 bmp] 28 bmp Vt Set:  [420 mL] 420 mL PEEP:  [5 cmH20-15 cmH20] 5 cmH20 Pressure Support:  [10 cmH20] 10 cmH20 Plateau Pressure:  [18 cmH20-24 cmH20] 21 cmH20   Intake/Output Summary (Last 24 hours) at 06/11/2018 1047 Last data filed at 06/11/2018 0800 Gross per 24 hour  Intake 2097.59 ml  Output 2015 ml  Net 82.59 ml   Filed Weights   06/09/18 0444 06/10/18 0300 06/11/18 0421  Weight: 96.1 kg 96.7 kg 96.5 kg    Examination: General: 58 year old intubated  Currently off sedation and weaning on 10/5, looks good HEENT: Endotracheal tube is in place, gastric tube in place, NCAT, No LAD Neuro: Off sedation, eyes open, following commands CV: S1, S2, RRR, No RMG  PULM: Bilateral chest excursion,Decreased breath sounds  throughout. Few rhonchi, Weaning 10/5 UE:AVWUGI:soft, non-tender, bsx4 active  Extremities: warm/dry, negative edema , no obvious deformities Skin: no rashes or lesions   Resolved Hospital Problem list     Assessment & Plan:  Acute hypoxemic respiratory failure: due to COVID-19.  Hypoxemia Net negative 222  Worsening CXR 4/12 Plan Extubate to VM>> Saturation goal is > 92% ABG good on 10/5,  despite  worsening CXR ARDS protocol ventilator  6 cc TV if fails extubation Continue Zithromax Continue Plaquenil ( Hold for QTc of > 0.5) CXR daily and prn ABG 4/13 am and prn Hold sedation for now Trend Covid biological labs  Renal Creatinine 1.11 Adequate UO, 1200 cc last 24 hours Hypokalemia Plan Trend BMET Monitor UO Avoid nephro toxic medications Maintain renal perfusion Gentle diuresis for euvolemia as renal function allows Lasix 40 BID x 2 doses 4/12 Replete K  ID Afebrile WBC 5.6 Currently on Plaquenil ( Day 5)  Rocephin ( Day 5) ,Zithromax ( Day 5) Blood Cultures and Sputum Cultures Negative CRP down trending Plan Follow Micro Consider decreasing Azithro to 5 day total and discontinuing Rocephin  Re-culture as is clinically indicated  Cardiac Hemodynamically stable No pressor requirements Plan Tele monitoring Trend VS Supportive care  DM: CBG (last 3)  Recent Labs    06/10/18 2347 06/11/18 0341 06/11/18 0806  GLUCAP 295* 298* 253*  Lantus increased to 10 units 4/11 A1C >> 13 Poor control Moderate SSI Plan: Will increase to 10 units 4/11 Will reassess once extubated for needs until after he passes swallow Sliding-scale insulin protocol Nutrition consult Continue CBG's Q 4  GI: No Acute Issues Plan Continue TF   Extubated 4/12 but initially requiring 100% NRB Labs   CBC: Recent Labs  Lab 06/06/18 1836  06/08/18 0406 06/08/18 1059 06/09/18 0236 06/10/18 0250 06/10/18 1404 06/11/18 0231  WBC 6.1  --  10.6*  --  8.0 4.8  --  5.6  NEUTROABS 5.0  --  9.3*  --  6.7 3.2  --  4.0  HGB 14.6   < > 13.7 12.9* 13.2 13.2 13.6 12.7*  HCT 42.4   < > 41.3 38.0* 38.3* 39.6 40.0 37.6*  MCV 91.4  --  93.2  --  93.6 95.2  --  95.2  PLT 144*  --  185  --  198 231  --  252   < > = values in this interval not displayed.    Basic Metabolic Panel: Recent Labs  Lab 06/06/18 1836  06/08/18 0406 06/08/18 1059 06/09/18 0236 06/10/18 0250  06/10/18 1404 06/11/18 0231  NA 135   < > 136 137 139 143 145 144  K 3.7   < > 3.5 3.6 4.6 3.9 3.6 3.8  CL 99  --  101  --  104 106  --  105  CO2 21*  --  23  --  25 24  --  25  GLUCOSE 139*  --  150*  --  226* 224*  --  324*  BUN 16  --  20  --  34* 35*  --  39*  CREATININE 0.75  --  1.15  --  1.39* 1.11  --  1.18  CALCIUM 8.4*  --  8.4*  --  8.2* 8.6*  --  8.5*  MG  --   --  2.3  --  2.6* 2.5*  --  2.5*  PHOS  --   --  2.6  --  3.1  --   --  3.0   < > = values in this interval not displayed.   GFR: Estimated Creatinine Clearance: 78.2 mL/min (by C-G formula based on SCr of 1.18 mg/dL). Recent Labs  Lab 06/06/18 1838 06/06/18 1842 06/08/18 0406 06/09/18 0236 06/10/18 0250 06/11/18 0231  PROCALCITON  --  <0.10  --   --   --   --   WBC  --   --  10.6* 8.0 4.8 5.6  LATICACIDVEN 1.1  --   --   --   --   --     Liver Function Tests: Recent Labs  Lab 06/06/18 1836 06/08/18 0406 06/09/18 0236 06/10/18 0250 06/11/18 0231  AST 46* 54* 50* 37 77*  ALT 32 37 40 40 74*  ALKPHOS 57 47 48 52 63  BILITOT 1.4* 0.8 0.9 0.7 0.8  PROT 7.8 6.6 5.4* 5.9* 5.3*  ALBUMIN 4.1 3.0* 2.6* 2.6* 2.7*   No results for input(s): LIPASE, AMYLASE in the last 168 hours. No results for input(s): AMMONIA in the last 168 hours.  ABG    Component Value Date/Time   PHART 7.352 06/10/2018 1404   PCO2ART 49.0 (H) 06/10/2018 1404   PO2ART 88.0 06/10/2018 1404   HCO3 27.2 06/10/2018 1404   TCO2 29 06/10/2018 1404   ACIDBASEDEF 3.0 (H) 06/07/2018 1807   O2SAT 96.0 06/10/2018 1404     Coagulation Profile: No results for input(s): INR, PROTIME in the last 168 hours.  Cardiac Enzymes: Recent Labs  Lab 06/06/18 1836 06/06/18 1842 06/08/18 0406 06/09/18 0236 06/10/18 0250 06/11/18 0231  CKTOTAL  --  110 160 127 69 108  TROPONINI <0.03  --   --   --   --   --     HbA1C: No results found for: HGBA1C  CBG: Recent Labs  Lab 06/10/18 1556 06/10/18 2037 06/10/18 2347 06/11/18 0341  06/11/18 0806  GLUCAP 251* 264* 295* 298* 253*     App cct 40 minutes   Bevelyn Ngo, AGACNP-BC [facility] PCCM Pager [phone] till 1 pm If no answer page [phone] 06/11/2018, 10:47 AM     _______________________________________________________________________   PCCM Attending:  58 yo M, admitted for covid 19 pna, AHRF intubated   BP 139/70   Pulse 86   Temp 99.3 F (37.4 C) (Axillary)   Resp (!) 24   Ht  (1.753 m)   Wt 96.5 kg   SpO2 97%   BMI 31.42 kg/m   Gen: middle aged male, intubated on vent, comfortable, following all commands  Heart: sinus on tele Lungs:bl vented breaths in PS, wave forms reviewed  Neuro: moving all 4 ext   A:  AHRF on MV  COVID19  CKD DMII   P: Extubated today, high risk for potential need to re-intubate. Observe in ICU  Added more diuresis  Holding hydroxychlor d/t qtc  Add potassium  Complete abx X 5 days  ssi  Wean fio2 and possible transfer out of ICU tomorrow if continued improvement   This patient is critically ill with multiple organ system failure; which, requires frequent high complexity decision making, assessment, support, evaluation, and titration of therapies. This was completed through the application of advanced monitoring technologies and extensive interpretation of multiple databases. During this encounter critical care time was devoted to patient care services described in this note for 34 minutes.   Josephine Igo, DO [facility] 06/11/2018 3:23 PM  Personal pager: [phone] If unanswered, please page CCM On-call: #[phone]

## 2018-06-12 ENCOUNTER — Inpatient Hospital Stay (HOSPITAL_COMMUNITY): Payer: 59

## 2018-06-12 DIAGNOSIS — L8990 Pressure ulcer of unspecified site, unspecified stage: Secondary | ICD-10-CM

## 2018-06-12 LAB — CBC WITH DIFFERENTIAL/PLATELET
Abs Immature Granulocytes: 0.14 10*3/uL — ABNORMAL HIGH (ref 0.00–0.07)
Basophils Absolute: 0 10*3/uL (ref 0.0–0.1)
Basophils Relative: 1 %
Eosinophils Absolute: 0.1 10*3/uL (ref 0.0–0.5)
Eosinophils Relative: 1 %
HCT: 42.5 % (ref 39.0–52.0)
Hemoglobin: 13.9 g/dL (ref 13.0–17.0)
Immature Granulocytes: 2 %
Lymphocytes Relative: 14 %
Lymphs Abs: 1.2 10*3/uL (ref 0.7–4.0)
MCH: 30.8 pg (ref 26.0–34.0)
MCHC: 32.7 g/dL (ref 30.0–36.0)
MCV: 94 fL (ref 80.0–100.0)
Monocytes Absolute: 0.9 10*3/uL (ref 0.1–1.0)
Monocytes Relative: 10 %
Neutro Abs: 6.2 10*3/uL (ref 1.7–7.7)
Neutrophils Relative %: 72 %
Platelets: 271 10*3/uL (ref 150–400)
RBC: 4.52 MIL/uL (ref 4.22–5.81)
RDW: 12.5 % (ref 11.5–15.5)
WBC: 8.5 10*3/uL (ref 4.0–10.5)
nRBC: 0 % (ref 0.0–0.2)

## 2018-06-12 LAB — POCT I-STAT 7, (LYTES, BLD GAS, ICA,H+H)
Acid-Base Excess: 6 mmol/L — ABNORMAL HIGH (ref 0.0–2.0)
Bicarbonate: 30.6 mmol/L — ABNORMAL HIGH (ref 20.0–28.0)
Calcium, Ion: 1.2 mmol/L (ref 1.15–1.40)
HCT: 38 % — ABNORMAL LOW (ref 39.0–52.0)
Hemoglobin: 12.9 g/dL — ABNORMAL LOW (ref 13.0–17.0)
O2 Saturation: 91 %
Patient temperature: 36.9
Potassium: 3.6 mmol/L (ref 3.5–5.1)
Sodium: 150 mmol/L — ABNORMAL HIGH (ref 135–145)
TCO2: 32 mmol/L (ref 22–32)
pCO2 arterial: 41.8 mmHg (ref 32.0–48.0)
pH, Arterial: 7.472 — ABNORMAL HIGH (ref 7.350–7.450)
pO2, Arterial: 58 mmHg — ABNORMAL LOW (ref 83.0–108.0)

## 2018-06-12 LAB — COMPREHENSIVE METABOLIC PANEL
ALT: 135 U/L — ABNORMAL HIGH (ref 0–44)
AST: 124 U/L — ABNORMAL HIGH (ref 15–41)
Albumin: 3.1 g/dL — ABNORMAL LOW (ref 3.5–5.0)
Alkaline Phosphatase: 88 U/L (ref 38–126)
Anion gap: 13 (ref 5–15)
BUN: 34 mg/dL — ABNORMAL HIGH (ref 6–20)
CO2: 27 mmol/L (ref 22–32)
Calcium: 9.1 mg/dL (ref 8.9–10.3)
Chloride: 109 mmol/L (ref 98–111)
Creatinine, Ser: 1 mg/dL (ref 0.61–1.24)
GFR calc Af Amer: >60 mL/min (ref >60)
GFR calc non Af Amer: >60 mL/min (ref >60)
Glucose, Bld: 212 mg/dL — ABNORMAL HIGH (ref 70–99)
Potassium: 3.7 mmol/L (ref 3.5–5.1)
Sodium: 149 mmol/L — ABNORMAL HIGH (ref 135–145)
Total Bilirubin: 1.4 mg/dL — ABNORMAL HIGH (ref 0.3–1.2)
Total Protein: 6.7 g/dL (ref 6.5–8.1)

## 2018-06-12 LAB — GLUCOSE, CAPILLARY
Glucose-Capillary: 205 mg/dL — ABNORMAL HIGH (ref 70–99)
Glucose-Capillary: 230 mg/dL — ABNORMAL HIGH (ref 70–99)
Glucose-Capillary: 238 mg/dL — ABNORMAL HIGH (ref 70–99)
Glucose-Capillary: 238 mg/dL — ABNORMAL HIGH (ref 70–99)
Glucose-Capillary: 248 mg/dL — ABNORMAL HIGH (ref 70–99)
Glucose-Capillary: 250 mg/dL — ABNORMAL HIGH (ref 70–99)
Glucose-Capillary: 264 mg/dL — ABNORMAL HIGH (ref 70–99)
Glucose-Capillary: 272 mg/dL — ABNORMAL HIGH (ref 70–99)

## 2018-06-12 LAB — CK: Total CK: 66 U/L (ref 49–397)

## 2018-06-12 LAB — C-REACTIVE PROTEIN: CRP: 0.9 mg/dL (ref <1.0)

## 2018-06-12 LAB — FERRITIN: Ferritin: 987 ng/mL — ABNORMAL HIGH (ref 24–336)

## 2018-06-12 LAB — PHOSPHORUS: Phosphorus: 2.6 mg/dL (ref 2.5–4.6)

## 2018-06-12 LAB — MAGNESIUM: Magnesium: 2.5 mg/dL — ABNORMAL HIGH (ref 1.7–2.4)

## 2018-06-12 LAB — LACTATE DEHYDROGENASE: LDH: 425 U/L — ABNORMAL HIGH (ref 98–192)

## 2018-06-12 MED ORDER — INSULIN GLARGINE 100 UNIT/ML ~~LOC~~ SOLN
12.0000 [IU] | Freq: Every day | SUBCUTANEOUS | Status: DC
  Administered 2018-06-13: 09:00:00 12 [IU] via SUBCUTANEOUS
  Filled 2018-06-12 (×2): qty 0.12

## 2018-06-12 MED ORDER — FUROSEMIDE 10 MG/ML IJ SOLN
40.0000 mg | Freq: Two times a day (BID) | INTRAMUSCULAR | Status: AC
  Administered 2018-06-12 (×2): 40 mg via INTRAVENOUS
  Filled 2018-06-12 (×2): qty 4

## 2018-06-12 MED ORDER — HEPARIN SODIUM (PORCINE) 5000 UNIT/ML IJ SOLN: 5000.0000 [IU] | Freq: Three times a day (TID) | INTRAMUSCULAR | Status: DC

## 2018-06-12 MED ORDER — CHLORHEXIDINE GLUCONATE 0.12 % MT SOLN
15.0000 mL | Freq: Two times a day (BID) | OROMUCOSAL | Status: DC
  Administered 2018-06-12 – 2018-06-18 (×13): 15 mL via OROMUCOSAL
  Filled 2018-06-12 (×9): qty 15

## 2018-06-12 MED ORDER — HEPARIN SODIUM (PORCINE) 5000 UNIT/ML IJ SOLN
5000.0000 [IU] | Freq: Three times a day (TID) | INTRAMUSCULAR | Status: DC
  Administered 2018-06-12 – 2018-06-15 (×8): 5000 [IU] via SUBCUTANEOUS
  Filled 2018-06-12 (×8): qty 1

## 2018-06-12 MED ORDER — POTASSIUM CHLORIDE 10 MEQ/50ML IV SOLN
10.0000 meq | INTRAVENOUS | Status: AC
  Administered 2018-06-12 – 2018-06-13 (×4): 10 meq via INTRAVENOUS
  Filled 2018-06-12 (×4): qty 50

## 2018-06-12 MED ORDER — POTASSIUM CHLORIDE 10 MEQ/50ML IV SOLN
10.0000 meq | INTRAVENOUS | Status: AC
  Administered 2018-06-12 (×4): 10 meq via INTRAVENOUS
  Filled 2018-06-12 (×4): qty 50

## 2018-06-12 MED ORDER — INSULIN GLARGINE 100 UNIT/ML ~~LOC~~ SOLN
2.0000 [IU] | Freq: Once | SUBCUTANEOUS | Status: AC
  Administered 2018-06-12: 2 [IU] via SUBCUTANEOUS
  Filled 2018-06-12: qty 0.02

## 2018-06-12 MED ORDER — ORAL CARE MOUTH RINSE
15.0000 mL | Freq: Two times a day (BID) | OROMUCOSAL | Status: DC
  Administered 2018-06-12 – 2018-06-18 (×8): 15 mL via OROMUCOSAL

## 2018-06-12 MED ORDER — HYDRALAZINE HCL 20 MG/ML IJ SOLN
5.0000 mg | Freq: Three times a day (TID) | INTRAMUSCULAR | Status: DC | PRN
  Administered 2018-06-12: 07:00:00 5 mg via INTRAVENOUS
  Filled 2018-06-12: qty 1

## 2018-06-12 NOTE — Progress Notes (Addendum)
NAME:  Adrian Mclean, MRN:  [id number], DOB:  12/24/1960, LOS: 5 ADMISSION DATE:  06/07/2018, CONSULTATION DATE:  06/07/2018 REFERRING MD:  Adrian SmallingRH Adrian Mclean, CHIEF COMPLAINT:  Worsening hypoxemia   Brief History   58 y/o male with PMH of DM who was diagnosed with COVID-19 on 4/3 who presented to [facility] with fever, cough, SOB and diarrhea.  Hydroxychloroquine was started and patient was [facility].  Patient was placed on 4L [place] and [facility].  PCCM was consulted on 4/8 for worsening hypoxemia.  Patient evidently finished a course of tamiflu, zithromax and cefdinir as outpatient with no improvement.  Started on plaquenil, rocephin and zithromax again in the hospital upon admission at 1 AM on 4/8, intubated on admit. Extubated 4/12.   Past Medical History  DM  Significant Hospital Events   4/08 Admit, known +COVID (dx 4/3).  Intubated  4/12 Extubated   Consults:  PCCM  Procedures:  ETT 4/8 >> 4/12   Significant Diagnostic Tests:    Micro Data:  COVID 4/3 >> POSITIVE MRSA 4/8 >> negative BCx2 4/7 >> negative Tracheal aspirate 4/8 >> normal flora   Antimicrobials:  Plaquenil 4/7 >> completed  Rocephin 4/7 >> 4/13 Zithromax 4/7 >> 4/13  Interim history/subjective:  Afebrile.  I/O - 3.1L UOP in last 24 hours / 2.4L negative in last 24 hours.  Pt denies acute problems.  States breathing is ok. Remains on NRB.   Objective   Blood pressure (!) 149/67, pulse 93, temperature 98.4 F (36.9 C), temperature source Axillary, resp. rate (!) 22, height 5\' 9"  (1.753 m), weight 92.5 kg, SpO2 91 %.    FiO2 (%):  [100 %] 100 %   Intake/Output Summary (Last 24 hours) at 06/12/2018 0941 Last data filed at 06/12/2018 0400 Gross per 24 hour  Intake 727.38 ml  Output 2975 ml  Net -2247.62 ml   Filed Weights   06/10/18 0300 06/11/18 0421 06/12/18 0500  Weight: 96.7 kg 96.5 kg 92.5 kg    Examination: General: adult male lying in bed, nAD HEENT: MM pink/dry, NRB in  place Neuro: Awake, alert, follows commands, appropriate  CV: s1s2 rrr, no m/r/g PULM: even/non-labored, lungs bilaterally diminished  NW:GNFAGI:soft, non-tender, bsx4 active  Extremities: warm/dry, trace generalized edema  Skin: no rashes or lesions  Resolved Hospital Problem list     Assessment & Plan:   Acute Hypoxemic Respiratory Failure secondary to COVID-19.   -completed plaquenil  -extubated 4/12 P: Wean O2 for sats >90% Consider transition to HFNC Push pulmonary hygiene-IS, mobilize  Stop ABX 4/13  Aspiration precautions  Consider trial of ice chips only this evening if tolerates > can not get SLP eval due to airborne isolation  Hypokalemia P: Repeat lasix 40 mg IV BID x2 doses with KCL  Trend BMP / urinary output Replace electrolytes as indicated Avoid nephrotoxic agents, ensure adequate renal perfusion  DM -Hgb A1c 13 P: Increase lantus to 12 units QD SSI  CBG Q4  Elevated LFT's P: Trend intermittently  At Risk Malnutrition  P: If not able to swallow by 4/14, consider cortrack    Best Practice  DVT: sq heparin  Family Communication: Called wife's number in chart with no answer 4/13 Disposition: ICU   Labs   CBC: Recent Labs  Lab 06/08/18 0406  06/09/18 0236 06/10/18 0250 06/10/18 1404 06/11/18 0231 06/11/18 1101 06/12/18 0333  WBC 10.6*  --  8.0 4.8  --  5.6  --  8.5  NEUTROABS  9.3*  --  6.7 3.2  --  4.0  --  6.2  HGB 13.7   < > 13.2 13.2 13.6 12.7* 11.2* 13.9  HCT 41.3   < > 38.3* 39.6 40.0 37.6* 33.0* 42.5  MCV 93.2  --  93.6 95.2  --  95.2  --  94.0  PLT 185  --  198 231  --  252  --  271   < > = values in this interval not displayed.    Basic Metabolic Panel: Recent Labs  Lab 06/08/18 0406  06/09/18 0236 06/10/18 0250 06/10/18 1404 06/11/18 0231 06/11/18 1101 06/11/18 1126 06/12/18 0333  NA 136   < > 139 143 145 144 149* 147* 149*  K 3.5   < > 4.6 3.9 3.6 3.8 2.9* 3.4* 3.7  CL 101  --  104 106  --  105  --  111 109  CO2 23   --  25 24  --  25  --  26 27  GLUCOSE 150*  --  226* 224*  --  324*  --  284* 212*  BUN 20  --  34* 35*  --  39*  --  35* 34*  CREATININE 1.15  --  1.39* 1.11  --  1.18  --  1.02 1.00  CALCIUM 8.4*  --  8.2* 8.6*  --  8.5*  --  8.4* 9.1  MG 2.3  --  2.6* 2.5*  --  2.5*  --  2.5* 2.5*  PHOS 2.6  --  3.1  --   --  3.0  --  2.2* 2.6   < > = values in this interval not displayed.   GFR: Estimated Creatinine Clearance: 90.4 mL/min (by C-G formula based on SCr of 1 mg/dL). Recent Labs  Lab 06/06/18 1838 06/06/18 1842  06/09/18 0236 06/10/18 0250 06/11/18 0231 06/12/18 0333  PROCALCITON  --  <0.10  --   --   --   --   --   WBC  --   --    < > 8.0 4.8 5.6 8.5  LATICACIDVEN 1.1  --   --   --   --   --   --    < > = values in this interval not displayed.    Liver Function Tests: Recent Labs  Lab 06/08/18 0406 06/09/18 0236 06/10/18 0250 06/11/18 0231 06/12/18 0333  AST 54* 50* 37 77* 124*  ALT 37 40 40 74* 135*  ALKPHOS 47 48 52 63 88  BILITOT 0.8 0.9 0.7 0.8 1.4*  PROT 6.6 5.4* 5.9* 5.3* 6.7  ALBUMIN 3.0* 2.6* 2.6* 2.7* 3.1*   No results for input(s): LIPASE, AMYLASE in the last 168 hours. No results for input(s): AMMONIA in the last 168 hours.  ABG    Component Value Date/Time   PHART 7.415 06/11/2018 1101   PCO2ART 39.7 06/11/2018 1101   PO2ART 64.0 (L) 06/11/2018 1101   HCO3 25.3 06/11/2018 1101   TCO2 26 06/11/2018 1101   ACIDBASEDEF 3.0 (H) 06/07/2018 1807   O2SAT 92.0 06/11/2018 1101     Coagulation Profile: No results for input(s): INR, PROTIME in the last 168 hours.  Cardiac Enzymes: Recent Labs  Lab 06/06/18 1836  06/08/18 0406 06/09/18 0236 06/10/18 0250 06/11/18 0231 06/12/18 0333  CKTOTAL  --    < > 160 127 69 108 66  TROPONINI <0.03  --   --   --   --   --   --    < > =  values in this interval not displayed.    HbA1C: No results found for: HGBA1C  CBG: Recent Labs  Lab 06/11/18 1733 06/11/18 2239 06/12/18 0012 06/12/18 0324  06/12/18 0801  GLUCAP 246* 272* 250* 205* 230*    CC Time: 30 minutes  Canary Brim, NP-C [facility] Pgr: [phone] or if no answer [phone] 06/12/2018, 9:41 AM    ______________________________________________________________________________   PCCM Attending:   58 yo M covid19 pna, was intubated, now extubated, on AHRF  BP (!) 141/76 (BP Location: Left Arm)   Pulse 78   Temp 99.3 F (37.4 C) (Axillary)   Resp (!) 23   Ht 5\' 9"  (1.753 m)   Wt 92.5 kg   SpO2 91%   BMI 30.11 kg/m   Gem: middle aged male, on NRB, comfortable  Heart: sinus on tele Lungs: bl vented breaths   A:  AHRF on NRB Covid19 PNA DMII Obesity   P:  Extubated yesterday  Still on NRB I suggest we watch him in ICU  High risk for need of re-intubation but he is stabilizing   This patient is critically ill with multiple organ system failure; which, requires frequent high complexity decision making, assessment, support, evaluation, and titration of therapies. This was completed through the application of advanced monitoring technologies and extensive interpretation of multiple databases. During this encounter critical care time was devoted to patient care services described in this note for 34 minutes.   Josephine Igo, DO [facility] 06/12/2018 4:54 PM  Personal pager: [phone] If unanswered, please page CCM On-call: #[phone]

## 2018-06-12 NOTE — Progress Notes (Signed)
Inpatient Diabetes Program Recommendations  AACE/ADA: New Consensus Statement on Inpatient Glycemic Control (2015)  Target Ranges:  Prepandial:   less than 140 mg/dL      Peak postprandial:   less than 180 mg/dL (1-2 hours)      Critically ill patients:  140 - 180 mg/dL   Lab Results  Component Value Date   GLUCAP 238 (H) 06/12/2018    Review of Glycemic Control Results for Adrian Mclean, Adrian Mclean (MRN [id number]) as of 06/12/2018 13:01  Ref. Range 06/11/2018 17:33 06/11/2018 22:39 06/12/2018 00:12 06/12/2018 03:24 06/12/2018 08:01 06/12/2018 12:29  Glucose-Capillary Latest Ref Range: 70 - 99 mg/dL 371 (H) 696 (H) 789 (H) 205 (H) 230 (H) 238 (H)   Diabetes history: DM 2 Outpatient Diabetes medications: Glucotrol 5 mg bid, Metformin 1000 mg bid Current orders for Inpatient glycemic control:  Lantus 12 units daily, Novolog moderate q 4 hours  Inpatient Diabetes Program Recommendations:    Please consider increasing Lantus to 20 units daily.    Thanks,  Beryl Meager, RN, BC-ADM Inpatient Diabetes Coordinator Pager [phone] (8a-5p)

## 2018-06-12 NOTE — Progress Notes (Signed)
Patient complaining of being hot, face is red and flushed. Vital signs normal. Oral temp was 99.1. Not complaining of SOB. Gave pt his fan and ice packs applied.

## 2018-06-12 NOTE — Progress Notes (Signed)
Nursing bedside swallow eval completed. Patient unable to tolerate 2 ice chips without coughing/getting choked. Patient to be maintained as NPO until speech eval completed. On call MD notified and updated. MD made aware of current scheduled PO meds. MD advised of NG tube placement until speech eval could be completed. NG tube insertion attempted without success. Patient gagging with any attempt to insert tube. Patient requiring non rebreather oxygen mask at this time and between coughing/gagging/removing mask for tube insertion, patients oxygen saturations would drop into 70s. Tube assessed after insertion attempt without any ability to be ausculated using air bolus in the stomach. No further attempts at NG tube to be made at this time, tube removed without complication. Medications not administered due to lack of NG access and patients inability to swallow.

## 2018-06-12 NOTE — Progress Notes (Signed)
eLink Physician-Brief Progress Note Patient Name: Adrian Mclean DOB: [birth year] MRN: [id number]   Date of Service  06/12/2018  HPI/Events of Note  Camera: Extubated on 12 th. On NRB mask. Looks comfortable.  Desaturates on mouth care as per bed side RN. Asking for prn anti HTN meds. SBP 156.  eICU Interventions  Hydralazine 5 mg IV q8 hrs Prn ordered for a day. Cr normal. Labs stable.      Intervention Category Intermediate Interventions: Hypertension - evaluation and management  Ranee Gosselin 06/12/2018, 6:43 AM

## 2018-06-12 NOTE — Progress Notes (Signed)
Switched patient from 15L on NRB to a HFNC at 15L. Patient tolerating fine for now, O2 sat at 92%.  Patient states that he prefers the HFNC. Will continue to monitor patient's respiratory status.

## 2018-06-12 NOTE — Progress Notes (Signed)
Assisted tele visit to patient with wife.  Onita Pfluger M, RN  

## 2018-06-12 NOTE — Progress Notes (Signed)
Speech-Language Pathology Contact Note:   Due to COVID-19 isolation restrictions, SLP conducted an indirect screening of swallowing function via chart review and discussion with RN  Findings: Pt was extubated on previous date but remains on NRB. Per chart review and discussion with RN, he desaturates easily and appears to do so upon removal of NRB. Risk of aspiration would be elevated and this is a pt with a tenuous respiratory status (per MD note on previous date, high reintubation risk).  Recommendations:    Diet: NPO  Medications: Via alternative means  Supervision: N/A  Strategies: N/A  SLP services will follow for readiness for direct swallowing assessment.   Ivar Drape, M.A. CCC-SLP Acute Herbalist [phone] Office [phone]

## 2018-06-13 LAB — CBC
HCT: 44.3 % (ref 39.0–52.0)
Hemoglobin: 14.8 g/dL (ref 13.0–17.0)
MCH: 31.9 pg (ref 26.0–34.0)
MCHC: 33.4 g/dL (ref 30.0–36.0)
MCV: 95.5 fL (ref 80.0–100.0)
Platelets: 304 10*3/uL (ref 150–400)
RBC: 4.64 MIL/uL (ref 4.22–5.81)
RDW: 12.5 % (ref 11.5–15.5)
WBC: 9.1 10*3/uL (ref 4.0–10.5)
nRBC: 0 % (ref 0.0–0.2)

## 2018-06-13 LAB — BASIC METABOLIC PANEL
Anion gap: 9 (ref 5–15)
BUN: 43 mg/dL — ABNORMAL HIGH (ref 6–20)
CO2: 31 mmol/L (ref 22–32)
Calcium: 9.5 mg/dL (ref 8.9–10.3)
Chloride: 111 mmol/L (ref 98–111)
Creatinine, Ser: 1.15 mg/dL (ref 0.61–1.24)
GFR calc Af Amer: >60 mL/min (ref >60)
GFR calc non Af Amer: >60 mL/min (ref >60)
Glucose, Bld: 246 mg/dL — ABNORMAL HIGH (ref 70–99)
Potassium: 4.2 mmol/L (ref 3.5–5.1)
Sodium: 151 mmol/L — ABNORMAL HIGH (ref 135–145)

## 2018-06-13 LAB — GLUCOSE, CAPILLARY
Glucose-Capillary: 179 mg/dL — ABNORMAL HIGH (ref 70–99)
Glucose-Capillary: 202 mg/dL — ABNORMAL HIGH (ref 70–99)
Glucose-Capillary: 215 mg/dL — ABNORMAL HIGH (ref 70–99)
Glucose-Capillary: 226 mg/dL — ABNORMAL HIGH (ref 70–99)
Glucose-Capillary: 230 mg/dL — ABNORMAL HIGH (ref 70–99)
Glucose-Capillary: 279 mg/dL — ABNORMAL HIGH (ref 70–99)

## 2018-06-13 MED ORDER — ENSURE MAX PROTEIN PO LIQD
11.0000 [oz_av] | Freq: Two times a day (BID) | ORAL | Status: DC
  Administered 2018-06-13 – 2018-06-16 (×6): 11 [oz_av] via ORAL
  Administered 2018-06-17: 21:00:00 237 mL via ORAL
  Administered 2018-06-18: 11 [oz_av] via ORAL
  Filled 2018-06-13 (×15): qty 330

## 2018-06-13 MED ORDER — INSULIN GLARGINE 100 UNIT/ML ~~LOC~~ SOLN
20.0000 [IU] | Freq: Every day | SUBCUTANEOUS | Status: DC
  Filled 2018-06-13 (×2): qty 0.2

## 2018-06-13 MED ORDER — INSULIN GLARGINE 100 UNIT/ML ~~LOC~~ SOLN
8.0000 [IU] | Freq: Once | SUBCUTANEOUS | Status: AC
  Administered 2018-06-13: 12:00:00 8 [IU] via SUBCUTANEOUS
  Filled 2018-06-13: qty 0.08

## 2018-06-13 NOTE — Progress Notes (Signed)
Assisted tele visit to patient with wife.  Marios Gaiser M Mirakle Tomlin, RN   

## 2018-06-13 NOTE — Progress Notes (Addendum)
NAME:  Adrian Mclean, MRN:  [id number], DOB:  07-Nov-1960, LOS: 6 ADMISSION DATE:  06/07/2018, CONSULTATION DATE:  06/07/2018 REFERRING MD:  [name], CHIEF COMPLAINT:  Worsening hypoxemia   Brief History   58 y/o male with PMH of DM who was diagnosed with COVID-19 on 4/3 who [facility] with fever, cough, SOB and diarrhea.  Hydroxychloroquine was started and [facility] was [facility].  Patient was placed on 4L [place] and [facility].  PCCM was consulted on 4/8 for worsening hypoxemia.  Patient evidently finished a course of tamiflu, zithromax and cefdinir as outpatient with no improvement.  Started on plaquenil, rocephin and zithromax again in the hospital upon admission at 1 AM on 4/8, intubated on admit. Extubated 4/12.   Past Medical History  DM  Significant Hospital Events   4/08 Admit, known +COVID (dx 4/3).  Intubated  4/12 Extubated  4/13 To HFNC 10L  Consults:  PCCM  Procedures:  ETT 4/8 >> 4/12   Significant Diagnostic Tests:    Micro Data:  COVID 4/3 >> POSITIVE MRSA 4/8 >> negative BCx2 4/7 >> negative Tracheal aspirate 4/8 >> normal flora   Antimicrobials:  Plaquenil 4/7 >> completed  Rocephin 4/7 >> 4/13 Zithromax 4/7 >> 4/13  Interim history/subjective:  RN reports pt transitioned to HFNC.  SLP evaluation pending.  Pt asking for water. Afebrile.  I/O- 2.2L UOP in last 24h / 1.6L neg in last 24 h   Objective   Blood pressure (!) 147/78, pulse 85, temperature 98.1 F (36.7 C), temperature source Oral, resp. rate (!) 25, height 5\' 9"  (1.753 m), weight 92.5 kg, SpO2 92 %.        Intake/Output Summary (Last 24 hours) at 06/13/2018 1034 Last data filed at 06/13/2018 0400 Gross per 24 hour  Intake 539.1 ml  Output 1900 ml  Net -1360.9 ml   Filed Weights   06/10/18 0300 06/11/18 0421 06/12/18 0500  Weight: 96.7 kg 96.5 kg 92.5 kg    Examination: General: adult male sitting up in chair, improved HEENT: MM pink/moist,  O2  Neuro: AAOx4, flat affect, MAE CV: s1s2 rrr, no m/r/g PULM: even/non-labored, lungs bilaterally clear anterior  WU:JWJXGI:soft, non-tender, bsx4 active  Extremities: warm/dry, no edema  Skin: no rashes or lesions   Resolved Hospital Problem list     Assessment & Plan:   Acute Hypoxemic Respiratory Failure secondary to COVID-19.   -completed plaquenil  -extubated 4/12 P: Wean O2 for sats >90% Continue HFNC  Pulmonary hygiene - IS, moblize  Monitor off abx  SLP evaluation pending   Hypokalemia Hypernatremia P: Trend BMP / urinary output Replace electrolytes as indicated Avoid nephrotoxic agents, ensure adequate renal perfusion Hold lasix 4/14, reassess in am 4/15 for further dosing  Free water as able once SLP completed  DM -Hgb A1c 13 P: SSI, moderate scale  CBG Q4  Increase lantus to 20 units daily   Elevated LFT's P: Trend LFT's intermittently   At Risk Malnutrition  P: SLP evaluation pending     Best Practice  DVT: sq heparin  Family Communication: Called wife Andrey Campanile([place]) 4/14, updated on patients status and plan of care.  Disposition: ICU   Labs   CBC: Recent Labs  Lab 06/08/18 0406  06/09/18 0236 06/10/18 0250  06/11/18 0231 06/11/18 1101 06/12/18 0326 06/12/18 0333 06/13/18 0400  WBC 10.6*  --  8.0 4.8  --  5.6  --   --  8.5 9.1  NEUTROABS 9.3*  --  6.7 3.2  --  4.0  --   --  6.2  --   HGB 13.7   < > 13.2 13.2   < > 12.7* 11.2* 12.9* 13.9 14.8  HCT 41.3   < > 38.3* 39.6   < > 37.6* 33.0* 38.0* 42.5 44.3  MCV 93.2  --  93.6 95.2  --  95.2  --   --  94.0 95.5  PLT 185  --  198 231  --  252  --   --  271 304   < > = values in this interval not displayed.    Basic Metabolic Panel: Recent Labs  Lab 06/08/18 0406  06/09/18 0236 06/10/18 0250  06/11/18 0231 06/11/18 1101 06/11/18 1126 06/12/18 0326 06/12/18 0333 06/13/18 0400  NA 136   < > 139 143   < > 144 149* 147* 150* 149* 151*  K 3.5   < > 4.6 3.9   < > 3.8 2.9* 3.4* 3.6 3.7 4.2  CL  101  --  104 106  --  105  --  111  --  109 111  CO2 23  --  25 24  --  25  --  26  --  27 31  GLUCOSE 150*  --  226* 224*  --  324*  --  284*  --  212* 246*  BUN 20  --  34* 35*  --  39*  --  35*  --  34* 43*  CREATININE 1.15  --  1.39* 1.11  --  1.18  --  1.02  --  1.00 1.15  CALCIUM 8.4*  --  8.2* 8.6*  --  8.5*  --  8.4*  --  9.1 9.5  MG 2.3  --  2.6* 2.5*  --  2.5*  --  2.5*  --  2.5*  --   PHOS 2.6  --  3.1  --   --  3.0  --  2.2*  --  2.6  --    < > = values in this interval not displayed.   GFR: Estimated Creatinine Clearance: 78.6 mL/min (by C-G formula based on SCr of 1.15 mg/dL). Recent Labs  Lab 06/06/18 1838 06/06/18 1842  06/10/18 0250 06/11/18 0231 06/12/18 0333 06/13/18 0400  PROCALCITON  --  <0.10  --   --   --   --   --   WBC  --   --    < > 4.8 5.6 8.5 9.1  LATICACIDVEN 1.1  --   --   --   --   --   --    < > = values in this interval not displayed.    Liver Function Tests: Recent Labs  Lab 06/08/18 0406 06/09/18 0236 06/10/18 0250 06/11/18 0231 06/12/18 0333  AST 54* 50* 37 77* 124*  ALT 37 40 40 74* 135*  ALKPHOS 47 48 52 63 88  BILITOT 0.8 0.9 0.7 0.8 1.4*  PROT 6.6 5.4* 5.9* 5.3* 6.7  ALBUMIN 3.0* 2.6* 2.6* 2.7* 3.1*   No results for input(s): LIPASE, AMYLASE in the last 168 hours. No results for input(s): AMMONIA in the last 168 hours.  ABG    Component Value Date/Time   PHART 7.472 (H) 06/12/2018 0326   PCO2ART 41.8 06/12/2018 0326   PO2ART 58.0 (L) 06/12/2018 0326   HCO3 30.6 (H) 06/12/2018 0326   TCO2 32 06/12/2018 0326   ACIDBASEDEF 3.0 (H) 06/07/2018 1807   O2SAT 91.0 06/12/2018 0326  Coagulation Profile: No results for input(s): INR, PROTIME in the last 168 hours.  Cardiac Enzymes: Recent Labs  Lab 06/06/18 1836  06/08/18 0406 06/09/18 0236 06/10/18 0250 06/11/18 0231 06/12/18 0333  CKTOTAL  --    < > 160 127 69 108 66  TROPONINI <0.03  --   --   --   --   --   --    < > = values in this interval not displayed.     HbA1C: No results found for: HGBA1C  CBG: Recent Labs  Lab 06/12/18 1528 06/12/18 2021 06/12/18 2329 06/13/18 0358 06/13/18 0820  GLUCAP 238* 248* 264* 215* 226*    CC Time: Talbert Nan, NP-C [facility] Pgr: [phone] or if no answer [phone] 06/13/2018, 10:34 AM    _____________________________________________________________________________   PCCM Attending:   58 yo M, admitted AHRF 2/2 BL COVID19 PNA, extubated, improving   BP 140/66   Pulse 87   Temp 98 F (36.7 C) (Oral)   Resp (!) 27   Ht 5\' 9"  (1.753 m)   Wt 92.5 kg   SpO2 90%   BMI 30.11 kg/m   Gen: resting in bed, up in chair this morning Lungs: on 15L salter catheter  Heart: sinus, regular   Labs reviewed in Epic  Na 151  CXR reviewed: BL infiltrates. The patient's images have been independently reviewed by me.    A:  AHRF, COVID19 PNA  Viral Bilateral COVID19 PNA  Elevated INR  Hypernatremia   P: Extubated for >24hours  Stable on 15L, titrating down  Plans for [facility] tomorrow.  Likely move from ICU  Passed SLP evaluation discussed with SLP    Josephine Igo, DO [facility] 06/13/2018 3:41 PM  Personal pager: [phone] If unanswered, please page CCM On-call: #[phone]

## 2018-06-13 NOTE — Progress Notes (Signed)
Nutrition Follow-up  DOCUMENTATION CODES:   Obesity unspecified  INTERVENTION:    Ensure Max po BID, each supplement provides 150 kcal and 30 grams of protein.   NUTRITION DIAGNOSIS:   Inadequate oral intake related to acute illness, poor appetite as evidenced by (diet just advanced to full liquids).  Ongoing  GOAL:   Patient will meet greater than or equal to 90% of their needs  Progressing  MONITOR:   PO intake, Supplement acceptance, Labs, Skin  ASSESSMENT:   58 yo male with PMH of DM and COVID-19 (dx on 4/3) who was transferred from [facility] with fever, cough, SOB, and diarrhea. Required intubation on 4/8.  Patient was extubated on 4/12.   S/P bedside swallow evaluation with SLP today. Diet has been advanced to full liquids. He is thirsty and requested full liquid diet. Suspect intake will be suboptimal initially, due to acute illness and recent intubation. Will add PO supplement.  Labs reviewed. Sodium 151 (H) CBG's: [phone]  Medications reviewed and include Novolog and Lantus.    NUTRITION - FOCUSED PHYSICAL EXAM:  unable to complete  Diet Order:   Diet Order            Diet full liquid Room service appropriate? Yes; Fluid consistency: Thin  Diet effective now              EDUCATION NEEDS:   No education needs have been identified at this time  Skin:  Skin Assessment: Skin Integrity Issues: Skin Integrity Issues:: Stage I Stage I: buttocks  Last BM:  4/10  Height:   Ht Readings from Last 1 Encounters:  06/07/18 5\' 9"  (1.753 m)    Weight:   Wt Readings from Last 1 Encounters:  06/12/18 92.5 kg    Ideal Body Weight:  72.7 kg  BMI:  Body mass index is 30.11 kg/m.  Estimated Nutritional Needs:   Kcal:  2000-2200  Protein:  110-120 gm  Fluid:  2-2.2 L    Joaquin Courts, RD, LDN, CNSC Pager [phone] After Hours Pager [phone]

## 2018-06-13 NOTE — Evaluation (Signed)
Clinical/Bedside Swallow Evaluation Patient Details  Name: Adrian Mclean MRN: [id number] Date of Birth: June 19, 1960  Today's Date: 06/13/2018 Time: SLP Start Time (ACUTE ONLY): 1040 SLP Stop Time (ACUTE ONLY): 1055 SLP Time Calculation (min) (ACUTE ONLY): 15 min  Past Medical History:  Past Medical History:  Diagnosis Date  . Diabetes mellitus without complication [facility](HCC)    Past Surgical History:  Past Surgical History:  Procedure Laterality Date  . APPENDECTOMY     HPI:  58 y/o male with PMH of DM who was diagnosed with COVID-19 on 4/3 who [facility] with fever, cough, SOB and diarrhea.  Hydroxychloroquine was started and [facility].  Patient was placed on 4L [place] and [facility]  PCCM was consulted on 4/8 for worsening hypoxemia.  Patient evidently finished a course of tamiflu, zithromax and cefdinir as outpatient with no improvement.  Started on plaquenil, rocephin and zithromax again in the hospital upon admission at 1 AM on 4/8, intubated on admit. Extubated 4/12.    Assessment / Plan / Recommendation Clinical Impression  Pt sitting in recliner; tolerating HFNC at 10 liters; Sp02 94%.  Pt with mildly hoarse voice; communicates needs; demonstrates mild disorientation.  Passed three oz water test without difficulty. Consumed solids and mixed solid/liquid consistencies with no overt s/s of aspiration; adequate respiratory/swallowing reciprocity.  Pt will not need an instrumental swallow study.  Recommend initiating a full liquid diet (per pt's preference); give meds whole in puree initially.  Discussed results/recs with pt, RN, and pt's wife on phone.  SLP will follow briefly for safety and diet progression.  F/u may include indirect services including chart screen, discussion with medical staff, phone interview with patient/family.         SLP Visit Diagnosis: Dysphagia, unspecified (R13.10)    Aspiration Risk       Diet  Recommendation   full liquids  Medication Administration: Whole meds with puree    Other  Recommendations Oral Care Recommendations: Oral care BID   Follow up Recommendations None      Frequency and Duration min 2x/week  1 week       Prognosis Prognosis for Safe Diet Advancement: Good      Swallow Study   General Date of Onset: 06/02/18 HPI: 58 y/o male with PMH of DM who was diagnosed with COVID-19 on 4/3 who [facility] with fever, cough, SOB and diarrhea.  Hydroxychloroquine was started and [facility].  Patient was placed on 4L [place] and [facility]  PCCM was consulted on 4/8 for worsening hypoxemia.  Patient evidently finished a course of tamiflu, zithromax and cefdinir as outpatient with no improvement.  Started on plaquenil, rocephin and zithromax again in the hospital upon admission at 1 AM on 4/8, intubated on admit. Extubated 4/12.  Type of Study: Bedside Swallow Evaluation Previous Swallow Assessment: no Diet Prior to this Study: NPO Temperature Spikes Noted: No Respiratory Status: Other (comment)(HFNC 10) History of Recent Intubation: Yes Length of Intubations (days): 4 days Date extubated: 06/11/18 Behavior/Cognition: Alert;Cooperative;Confused Oral Cavity Assessment: Within Functional Limits Oral Care Completed by SLP: Recent completion by staff Oral Cavity - Dentition: Adequate natural dentition Vision: Functional for self-feeding Self-Feeding Abilities: Able to feed self Patient Positioning: Upright in chair Baseline Vocal Quality: Hoarse Volitional Swallow: Able to elicit    Oral/Motor/Sensory Function Overall Oral Motor/Sensory Function: Within functional limits   Ice Chips Ice chips: Within functional limits   Thin  Liquid Thin Liquid: Within functional limits    Nectar Thick Nectar Thick Liquid: Not tested   Honey Thick Honey Thick Liquid: Not tested   Puree Puree: Within functional limits   Solid      Solid: Within functional limits      Blenda Mounts Laurice 06/13/2018,11:42 AM

## 2018-06-14 LAB — BASIC METABOLIC PANEL
Anion gap: 10 (ref 5–15)
BUN: 41 mg/dL — ABNORMAL HIGH (ref 6–20)
CO2: 29 mmol/L (ref 22–32)
Calcium: 9 mg/dL (ref 8.9–10.3)
Chloride: 103 mmol/L (ref 98–111)
Creatinine, Ser: 1.01 mg/dL (ref 0.61–1.24)
GFR calc Af Amer: >60 mL/min (ref >60)
GFR calc non Af Amer: >60 mL/min (ref >60)
Glucose, Bld: 198 mg/dL — ABNORMAL HIGH (ref 70–99)
Potassium: 3.4 mmol/L — ABNORMAL LOW (ref 3.5–5.1)
Sodium: 142 mmol/L (ref 135–145)

## 2018-06-14 LAB — CBC
HCT: 41.8 % (ref 39.0–52.0)
Hemoglobin: 13.7 g/dL (ref 13.0–17.0)
MCH: 31.4 pg (ref 26.0–34.0)
MCHC: 32.8 g/dL (ref 30.0–36.0)
MCV: 95.7 fL (ref 80.0–100.0)
Platelets: 294 10*3/uL (ref 150–400)
RBC: 4.37 MIL/uL (ref 4.22–5.81)
RDW: 12.4 % (ref 11.5–15.5)
WBC: 9.1 10*3/uL (ref 4.0–10.5)
nRBC: 0 % (ref 0.0–0.2)

## 2018-06-14 LAB — GLUCOSE, CAPILLARY
Glucose-Capillary: 187 mg/dL — ABNORMAL HIGH (ref 70–99)
Glucose-Capillary: 177 mg/dL — ABNORMAL HIGH (ref 70–99)
Glucose-Capillary: 243 mg/dL — ABNORMAL HIGH (ref 70–99)
Glucose-Capillary: 161 mg/dL — ABNORMAL HIGH (ref 70–99)
Glucose-Capillary: 190 mg/dL — ABNORMAL HIGH (ref 70–99)

## 2018-06-14 LAB — HEMOGLOBIN A1C
Hgb A1c MFr Bld: 8.9 % — ABNORMAL HIGH (ref 4.8–5.6)
Mean Plasma Glucose: 208.73 mg/dL

## 2018-06-14 MED ORDER — INSULIN ASPART 100 UNIT/ML ~~LOC~~ SOLN
0.0000 [IU] | Freq: Three times a day (TID) | SUBCUTANEOUS | Status: DC
  Administered 2018-06-14: 4 [IU] via SUBCUTANEOUS
  Administered 2018-06-14 – 2018-06-15 (×2): 7 [IU] via SUBCUTANEOUS
  Administered 2018-06-15 – 2018-06-17 (×6): 4 [IU] via SUBCUTANEOUS
  Administered 2018-06-17: 17:00:00 3 [IU] via SUBCUTANEOUS
  Administered 2018-06-17: 12:00:00 11 [IU] via SUBCUTANEOUS
  Administered 2018-06-18: 13:00:00 7 [IU] via SUBCUTANEOUS
  Administered 2018-06-18: 17:00:00 3 [IU] via SUBCUTANEOUS
  Administered 2018-06-18: 7 [IU] via SUBCUTANEOUS

## 2018-06-14 MED ORDER — POTASSIUM CHLORIDE 20 MEQ/15ML (10%) PO SOLN
20.0000 meq | Freq: Once | ORAL | Status: AC
  Administered 2018-06-14: 20 meq via ORAL
  Filled 2018-06-14: qty 15

## 2018-06-14 MED ORDER — INSULIN GLARGINE 100 UNIT/ML ~~LOC~~ SOLN
22.0000 [IU] | Freq: Every day | SUBCUTANEOUS | Status: DC
  Administered 2018-06-14 – 2018-06-18 (×5): 22 [IU] via SUBCUTANEOUS
  Filled 2018-06-14 (×7): qty 0.22

## 2018-06-14 NOTE — Progress Notes (Signed)
Speech-Language Pathology Contact Note:   Due to COVID-19 isolation restrictions, SLP conducted an indirect screening of swallowing function via direct interview with patient by phone  Findings: Pt tolerating liquids per RN and his report. No signs of aspiration. He confirms he is ready to start eating and drinking regular foods.   Recommendations:    Diet: Regular solids  Medications: Whole with liquids  Supervision: Full supervision  Strategies: Sit upright  SLP services will sign off. Please do not hesitate to re-order our service if patient's swallowing function deteriorates.  Harlon Ditty, MA CCC-SLP  Acute Rehabilitation Services Pager [phone] Office [phone]

## 2018-06-14 NOTE — Progress Notes (Signed)
Assisted tele visit to patient with wife.  Adrian Van M Oren Barella, RN   

## 2018-06-14 NOTE — Progress Notes (Signed)
Report called to [facility] [phone]. Patient with no complaints at current time.

## 2018-06-14 NOTE — Progress Notes (Addendum)
NAME:  Adrian Mclean, MRN:  [id number], DOB:  March 04, 1960, LOS: 7 ADMISSION DATE:  06/07/2018, CONSULTATION DATE:  06/07/2018 REFERRING MD:  Darcel Smalling Marland Mcalpine, CHIEF COMPLAINT:  Worsening hypoxemia   Brief History   58 y/o male with PMH of DM who was diagnosed with COVID-19 on 4/3 who presented to [facility] with fever, cough, SOB and diarrhea.  Hydroxychloroquine was started and patient was transferred to [facility] for admission.  Patient was placed on 4L [place] and transferred to [facility].  PCCM was consulted on 4/8 for worsening hypoxemia.  Patient evidently finished a course of tamiflu, zithromax and cefdinir as outpatient with no improvement.  Started on plaquenil, rocephin and zithromax again in the hospital upon admission at 1 AM on 4/8, intubated on admit. Extubated 4/12.   Past Medical History  DM  Significant Hospital Events   4/08 Admit, known +COVID (dx 4/3).  Intubated  4/12 Extubated  4/13 To HFNC 10L 4/15 O2 to 8L  Consults:  PCCM  Procedures:  ETT 4/8 >> 4/12   Significant Diagnostic Tests:    Micro Data:  COVID 4/3 >> POSITIVE MRSA 4/8 >> negative BCx2 4/7 >> negative Tracheal aspirate 4/8 >> normal flora   Antimicrobials:  Plaquenil 4/7 >> completed  Rocephin 4/7 >> 4/13 Zithromax 4/7 >> 4/13  Interim history/subjective:  Pt reports feeling tired but overall better.  No acute events overnight. O2 weaned to 8L. Denies cough, fever/chills.    Objective   Blood pressure (!) 143/73, pulse 82, temperature 97.7 F (36.5 C), temperature source Oral, resp. rate 20, height 5\' 9"  (1.753 m), weight 92.5 kg, SpO2 92 %.        Intake/Output Summary (Last 24 hours) at 06/14/2018 0956 Last data filed at 06/14/2018 0800 Gross per 24 hour  Intake 1326 ml  Output 950 ml  Net 376 ml   Filed Weights   06/10/18 0300 06/11/18 0421 06/12/18 0500  Weight: 96.7 kg 96.5 kg 92.5 kg    Examination: General: adult male sitting in chair in NAD HEENT: MM pink/moist, [place] O2 Neuro: AAOx4, speech  clear, flat affect CV: s1s2 rrr, no m/r/g PULM: even/non-labored, lungs bilaterally clear / diminished  ZG:YFVC, non-tender, bsx4 active  Extremities: warm/dry, no edema  Skin: no rashes or lesions  Resolved Hospital Problem list     Assessment & Plan:   Acute Hypoxemic Respiratory Failure secondary to COVID-19.   -completed plaquenil  -extubated 4/12 P: Wean O2 for sats >90% Transfer to progressive care  Pulmonary hygiene - IS, mobilize  Continue diet   Hypokalemia Hypernatremia P: Trend BMP / urinary output Replace electrolytes as indicated Avoid nephrotoxic agents, ensure adequate renal perfusion  DM -Hgb A1c 13 P: SSI, resistant scale  CBG Q4  Increase lantus to 22 units QD  Elevated LFT's P: Follow intermittently   At Risk Malnutrition  P: Advance diet per SLP     Best Practice  DVT: sq heparin  Family Communication: Called wife Andrey Campanile) 4/14, updated on patients status and plan of care.  Disposition: Tx to progressive care, to [facility].  PCCM will sign off.    Labs   CBC: Recent Labs  Lab 06/08/18 0406  06/09/18 0236 06/10/18 0250  06/11/18 0231 06/11/18 1101 06/12/18 0326 06/12/18 0333 06/13/18 0400 06/14/18 0339  WBC 10.6*  --  8.0 4.8  --  5.6  --   --  8.5 9.1 9.1  NEUTROABS 9.3*  --  6.7 3.2  --  4.0  --   --  6.2  --   --   HGB 13.7   < > 13.2 13.2   < > 12.7* 11.2* 12.9* 13.9 14.8 13.7  HCT 41.3   < > 38.3* 39.6   < > 37.6* 33.0* 38.0* 42.5 44.3 41.8  MCV 93.2  --  93.6 95.2  --  95.2  --   --  94.0 95.5 95.7  PLT 185  --  198 231  --  252  --   --  271 304 294   < > = values in this interval not displayed.    Basic Metabolic Panel: Recent Labs  Lab 06/08/18 0406  06/09/18 0236 06/10/18 0250  06/11/18 0231  06/11/18 1126 06/12/18 0326 06/12/18 0333 06/13/18 0400 06/14/18 0339  NA 136   < > 139 143   < > 144   < > 147* 150* 149* 151* 142  K 3.5   < > 4.6 3.9   < > 3.8   < > 3.4* 3.6 3.7 4.2 3.4*  CL 101  --  104 106  --  105   --  111  --  109 111 103  CO2 23  --  25 24  --  25  --  26  --  GLUCOSE 150*  --  226* 224*  --  324*  --  284*  --  212* 246* 198*  BUN 20  --  34* 35*  --  39*  --  35*  --  34* 43* 41*  CREATININE 1.15  --  1.39* 1.11  --  1.18  --  1.02  --  1.00 1.15 1.01  CALCIUM 8.4*  --  8.2* 8.6*  --  8.5*  --  8.4*  --  9.1 9.5 9.0  MG 2.3  --  2.6* 2.5*  --  2.5*  --  2.5*  --  2.5*  --   --   PHOS 2.6  --  3.1  --   --  3.0  --  2.2*  --  2.6  --   --    < > = values in this interval not displayed.   GFR: Estimated Creatinine Clearance: 89.5 mL/min (by C-G formula based on SCr of 1.01 mg/dL). Recent Labs  Lab 06/11/18 0231 06/12/18 0333 06/13/18 0400 06/14/18 0339  WBC 5.6 8.5 9.1 9.1    Liver Function Tests: Recent Labs  Lab 06/08/18 0406 06/09/18 0236 06/10/18 0250 06/11/18 0231 06/12/18 0333  AST 54* 50* 37 77* 124*  ALT 37 40 40 74* 135*  ALKPHOS 47 48 52 63 88  BILITOT 0.8 0.9 0.7 0.8 1.4*  PROT 6.6 5.4* 5.9* 5.3* 6.7  ALBUMIN 3.0* 2.6* 2.6* 2.7* 3.1*   No results for input(s): LIPASE, AMYLASE in the last 168 hours. No results for input(s): AMMONIA in the last 168 hours.  ABG    Component Value Date/Time   PHART 7.472 (H) 06/12/2018 0326   PCO2ART 41.8 06/12/2018 0326   PO2ART 58.0 (L) 06/12/2018 0326   HCO3 30.6 (H) 06/12/2018 0326   TCO2 32 06/12/2018 0326   ACIDBASEDEF 3.0 (H) 06/07/2018 1807   O2SAT 91.0 06/12/2018 0326     Coagulation Profile: No results for input(s): INR, PROTIME in the last 168 hours.  Cardiac Enzymes: Recent Labs  Lab 06/08/18 0406 06/09/18 0236 06/10/18 0250 06/11/18 0231 06/12/18 0333  CKTOTAL 160 127 69 108 66    HbA1C: No results found for: HGBA1C  CBG: Recent  Labs  Lab 06/13/18 1501 06/13/18 1931 06/13/18 2333 06/14/18 0333 06/14/18 0747  GLUCAP 202* 230* 179* 187* 177*    CC Time: n/a  Canary BrimBrandi , NP-C [facility] Pgr: [phone] or if no answer [phone] 06/14/2018, 9:56  AM    __

## 2018-06-15 ENCOUNTER — Telehealth: Payer: Self-pay | Admitting: Pulmonary Disease

## 2018-06-15 LAB — BASIC METABOLIC PANEL
Anion gap: 11 (ref 5–15)
BUN: 34 mg/dL — ABNORMAL HIGH (ref 6–20)
CO2: 27 mmol/L (ref 22–32)
Calcium: 8.8 mg/dL — ABNORMAL LOW (ref 8.9–10.3)
Chloride: 101 mmol/L (ref 98–111)
Creatinine, Ser: 1.03 mg/dL (ref 0.61–1.24)
GFR calc Af Amer: >60 mL/min (ref >60)
GFR calc non Af Amer: >60 mL/min (ref >60)
Glucose, Bld: 180 mg/dL — ABNORMAL HIGH (ref 70–99)
Potassium: 3.6 mmol/L (ref 3.5–5.1)
Sodium: 139 mmol/L (ref 135–145)

## 2018-06-15 LAB — QUANTIFERON-TB GOLD PLUS (RQFGPL)
QuantiFERON Mitogen Value: 0.37 IU/mL
QuantiFERON Nil Value: 0.02 IU/mL
QuantiFERON TB1 Ag Value: 0.02 IU/mL
QuantiFERON TB2 Ag Value: 0.02 IU/mL

## 2018-06-15 LAB — QUANTIFERON-TB GOLD PLUS: QuantiFERON-TB Gold Plus: UNDETERMINED

## 2018-06-15 LAB — GLUCOSE, CAPILLARY
Glucose-Capillary: 208 mg/dL — ABNORMAL HIGH (ref 70–99)
Glucose-Capillary: 199 mg/dL — ABNORMAL HIGH (ref 70–99)
Glucose-Capillary: 181 mg/dL — ABNORMAL HIGH (ref 70–99)

## 2018-06-15 LAB — CBC
HCT: 39.3 % (ref 39.0–52.0)
Hemoglobin: 13.4 g/dL (ref 13.0–17.0)
MCH: 32 pg (ref 26.0–34.0)
MCHC: 34.1 g/dL (ref 30.0–36.0)
MCV: 93.8 fL (ref 80.0–100.0)
Platelets: 284 10*3/uL (ref 150–400)
RBC: 4.19 MIL/uL — ABNORMAL LOW (ref 4.22–5.81)
RDW: 12 % (ref 11.5–15.5)
WBC: 8.8 10*3/uL (ref 4.0–10.5)
nRBC: 0 % (ref 0.0–0.2)

## 2018-06-15 MED ORDER — ALBUTEROL SULFATE HFA 108 (90 BASE) MCG/ACT IN AERS
2.0000 | INHALATION_SPRAY | Freq: Four times a day (QID) | RESPIRATORY_TRACT | Status: DC
  Administered 2018-06-15 – 2018-06-18 (×10): 2 via RESPIRATORY_TRACT
  Filled 2018-06-15: qty 6.7

## 2018-06-15 MED ORDER — ENOXAPARIN SODIUM 40 MG/0.4ML ~~LOC~~ SOLN
40.0000 mg | SUBCUTANEOUS | Status: DC
  Administered 2018-06-15 – 2018-06-18 (×4): 40 mg via SUBCUTANEOUS
  Filled 2018-06-15 (×4): qty 0.4

## 2018-06-15 NOTE — Plan of Care (Signed)
  Problem: Education: Goal: Knowledge of General Education information will improve Description: Including pain rating scale, medication(s)/side effects and non-pharmacologic comfort measures Outcome: Progressing   Problem: Clinical Measurements: Goal: Respiratory complications will improve Outcome: Progressing   Problem: Coping: Goal: Level of anxiety will decrease Outcome: Progressing   

## 2018-06-15 NOTE — Telephone Encounter (Signed)
I called Adrian Mclean and his wife tonight to explain that he has been identified for transfer to [facility] for his remaining care while hospitalized.  They voiced understanding.  Questions were answered.

## 2018-06-15 NOTE — Progress Notes (Signed)
Ok to change SQ heparin to Lovenox for DVT prophylaxis to limit nurse exposure per Dr. Margo Aye.  Ulyses Southward, PharmD, BCIDP, AAHIVP, CPP Infectious Disease Pharmacist 06/15/2018 8:41 AM

## 2018-06-15 NOTE — Progress Notes (Signed)
PROGRESS NOTE  Adrian AlarDanny Mclean ZOX:[id number] DOB: [birth year] DOA: 06/07/2018 PCP: [place], [facility]  HPI/Recap of past 24 hours: 58 y/o male with PMH of DM who was diagnosed with COVID-19 on 4/3 who [facility] with fever, cough, SOB and diarrhea.  Hydroxychloroquine was started and patient was [facility].  Patient was placed on 4L [place] and [facility].  PCCM was consulted on 4/8 for worsening hypoxemia.  Patient evidently finished a course of tamiflu, zithromax and cefdinir as outpatient with no improvement.  Started on plaquenil, rocephin and zithromax again in the hospital upon admission at 1 AM on 4/8, intubated on admit.  Intubated on 4/08 extubated 4/12. 4/13 To HFNC 10L. 4/15 O2 to 8L  Positive COVID-19.  [facility]  06/15/18: Patient seen and examined at his bedside.  States he feels weak.  He denies any chest pain or dyspnea at rest.  Cough is improving.  O2 requirement improving from 8 L by nasal cannula to 5 L with saturation >90%.   Assessment/Plan: Principal Problem:   Acute respiratory disease due to COVID-19 virus Active Problems:   Diabetes mellitus type II, non insulin dependent (HCC)   Acute respiratory failure with hypoxia (HCC)   ARDS (adult respiratory distress syndrome) (HCC)   COVID-19 virus detected   Acute respiratory distress syndrome (ARDS) due to COVID-19 virus   Pressure injury of skin  Resolving ARDS secondary to COVID-19 infection Extubated on 06/11/2018 [facility] Oxygen requirement continues to improve from 8 L to 5 L to maintain O2 saturation greater than 90% Continue to monitor O2 saturation Continue inhalers Independently reviewed chest x-ray done on 06/12/2018 which revealed bilateral patchy infiltrates worse from chest x-ray done on 4/12 Independently reviewed lab studies which shows stable CBC and BMP  COVID-19 pneumonia Management as  stated above Elevated LDH and ferritin Repeat markers tomorrow 06/16/2018  Type 2 diabetes with hyperglycemia Last A1c 8.9 from 06/14/2018 Continue insulin sliding scale Continue to hold oral anti-glycemics  Elevated LFTs No history of liver disease Not on statin drug at home Possibly associated with his viral infection/COVID-19 Repeat CMP tomorrow 06/16/2018  Hypokalemia, resolved  Physical debility/generalized weakness Suspect from present illness/viral infection      Code Status: Full code  Family Communication: None at bedside.  Will update family.  Disposition Plan: Home possibly in 2 days when hemodynamically stable with oxygen requirement less than 4 L.   Consultants:  PCCM  Procedures:  Intubation and extubation  Antimicrobials:  None  DVT prophylaxis: Subcu Lovenox daily   Objective: Vitals:   06/14/18 1928 06/14/18 2327 06/15/18 0327 06/15/18 0800  BP: (!) 154/78 (!) 142/64 (!) 120/50 (!) 121/96  Pulse: 73 82 81 65  Resp: (!) 21 19 (!) 21 18  Temp: 98.8 F (37.1 C) 98.8 F (37.1 C) 98.7 F (37.1 C) 98.4 F (36.9 C)  TempSrc: Oral Oral Oral Oral  SpO2: 91%  96% 92%  Weight:      Height:        Intake/Output Summary (Last 24 hours) at 06/15/2018 0915 Last data filed at 06/15/2018 0800 Gross per 24 hour  Intake 483 ml  Output 1400 ml  Net -917 ml   Filed Weights   06/10/18 0300 06/11/18 0421 06/12/18 0500  Weight: 96.7 kg 96.5 kg 92.5 kg    Exam:  . General: 58 y.o. year-old male well developed well nourished in no acute  distress.  Alert and oriented x3. . Cardiovascular: Regular rate and rhythm with no rubs or gallops.  No thyromegaly or JVD noted.   Marland Kitchen Respiratory: Mild rales at bases with no wheezes.  Good inspiratory effort. . Abdomen: Soft nontender nondistended with normal bowel sounds x4 quadrants. . Musculoskeletal: Trace lower extremity edema. 2/4 pulses in all 4 extremities. Marland Kitchen Psychiatry: Mood is appropriate for condition  and setting   Data Reviewed: CBC: Recent Labs  Lab 06/09/18 0236 06/10/18 0250  06/11/18 0231  06/12/18 0326 06/12/18 0333 06/13/18 0400 06/14/18 0339 06/15/18 0336  WBC 8.0 4.8  --  5.6  --   --  8.5 9.1 9.1 8.8  NEUTROABS 6.7 3.2  --  4.0  --   --  6.2  --   --   --   HGB 13.2 13.2   < > 12.7*   < > 12.9* 13.9 14.8 13.7 13.4  HCT 38.3* 39.6   < > 37.6*   < > 38.0* 42.5 44.3 41.8 39.3  MCV 93.6 95.2  --  95.2  --   --  94.0 95.5 95.7 93.8  PLT 198 231  --  252  --   --  271 304 294 284   < > = values in this interval not displayed.   Basic Metabolic Panel: Recent Labs  Lab 06/09/18 0236 06/10/18 0250  06/11/18 0231  06/11/18 1126 06/12/18 0326 06/12/18 0333 06/13/18 0400 06/14/18 0339 06/15/18 0336  NA 139 143   < > 144   < > 147* 150* 149* 151* 142 139  K 4.6 3.9   < > 3.8   < > 3.4* 3.6 3.7 4.2 3.4* 3.6  CL 104 106  --  105  --  111  --  109 111 103 101  CO2 25 24  --  25  --  26  --  27 31 29 27   GLUCOSE 226* 224*  --  324*  --  284*  --  212* 246* 198* 180*  BUN 34* 35*  --  39*  --  35*  --  34* 43* 41* 34*  CREATININE 1.39* 1.11  --  1.18  --  1.02  --  1.00 1.15 1.01 1.03  CALCIUM 8.2* 8.6*  --  8.5*  --  8.4*  --  9.1 9.5 9.0 8.8*  MG 2.6* 2.5*  --  2.5*  --  2.5*  --  2.5*  --   --   --   PHOS 3.1  --   --  3.0  --  2.2*  --  2.6  --   --   --    < > = values in this interval not displayed.   GFR: Estimated Creatinine Clearance: 87.8 mL/min (by C-G formula based on SCr of 1.03 mg/dL). Liver Function Tests: Recent Labs  Lab 06/09/18 0236 06/10/18 0250 06/11/18 0231 06/12/18 0333  AST 50* 37 77* 124*  ALT 40 40 74* 135*  ALKPHOS 48 52 63 88  BILITOT 0.9 0.7 0.8 1.4*  PROT 5.4* 5.9* 5.3* 6.7  ALBUMIN 2.6* 2.6* 2.7* 3.1*   No results for input(s): LIPASE, AMYLASE in the last 168 hours. No results for input(s): AMMONIA in the last 168 hours. Coagulation Profile: No results for input(s): INR, PROTIME in the last 168 hours. Cardiac Enzymes:  Recent Labs  Lab 06/09/18 0236 06/10/18 0250 06/11/18 0231 06/12/18 0333  CKTOTAL 127 69 108 66   BNP (last 3 results) No results  for input(s): PROBNP in the last 8760 hours. HbA1C: Recent Labs    06/14/18 0339  HGBA1C 8.9*   CBG: Recent Labs  Lab 06/14/18 0747 06/14/18 1123 06/14/18 1647 06/14/18 2053 06/15/18 0807  GLUCAP 177* 243* 161* 190* 208*   Lipid Profile: No results for input(s): CHOL, HDL, LDLCALC, TRIG, CHOLHDL, LDLDIRECT in the last 72 hours. Thyroid Function Tests: No results for input(s): TSH, T4TOTAL, FREET4, T3FREE, THYROIDAB in the last 72 hours. Anemia Panel: No results for input(s): VITAMINB12, FOLATE, FERRITIN, TIBC, IRON, RETICCTPCT in the last 72 hours. Urine analysis: No results found for: COLORURINE, APPEARANCEUR, LABSPEC, PHURINE, GLUCOSEU, HGBUR, BILIRUBINUR, KETONESUR, PROTEINUR, UROBILINOGEN, NITRITE, LEUKOCYTESUR Sepsis Labs: (procalcitonin:4,lacticidven:4)  ) Recent Results (from the past 240 hour(s))  Blood culture (routine x 2)     Status: None   Collection Time: 06/06/18  6:43 PM  Result Value Ref Range Status   Specimen Description BLOOD BLOOD LEFT HAND  Final   Special Requests   Final    BOTTLES DRAWN AEROBIC AND ANAEROBIC Blood Culture adequate volume   Culture   Final    NO GROWTH 5 DAYS Performed at [facility], [address] [place], [place] [address]    Report Status 06/11/2018 FINAL  Final  Blood culture (routine x 2)     Status: None   Collection Time: 06/06/18  6:43 PM  Result Value Ref Range Status   Specimen Description BLOOD BLOOD RIGHT FOREARM  Final   Special Requests   Final    BOTTLES DRAWN AEROBIC AND ANAEROBIC Blood Culture results may not be optimal due to an inadequate volume of blood received in culture bottles   Culture   Final    NO GROWTH 5 DAYS Performed at [facility], [address] [place], [place] [address]    Report Status 06/11/2018 FINAL  Final  MRSA PCR  Screening     Status: None   Collection Time: 06/07/18 12:51 AM  Result Value Ref Range Status   MRSA by PCR NEGATIVE NEGATIVE Final    Comment:        The GeneXpert MRSA Assay (FDA approved for NASAL specimens only), is one component of a comprehensive MRSA colonization surveillance program. It is not intended to diagnose MRSA infection nor to guide or monitor treatment for MRSA infections. Performed at [facility], [address], [place], [place] [address]   Culture, respiratory (non-expectorated)     Status: None   Collection Time: 06/07/18  5:02 PM  Result Value Ref Range Status   Specimen Description TRACHEAL ASPIRATE  Final   Special Requests NONE  Final   Gram Stain   Final    MODERATE WBC PRESENT, PREDOMINANTLY PMN FEW GRAM POSITIVE COCCI FEW GRAM POSITIVE RODS RARE GRAM NEGATIVE RODS    Culture   Final    FEW Consistent with normal respiratory flora. Performed at [facility], [address] [place], [place] [address]    Report Status 06/10/2018 FINAL  Final      Studies: No results found.  Scheduled Meds: . chlorhexidine  15 mL Mouth Rinse BID  . enoxaparin (LOVENOX) injection  40 mg Subcutaneous Q24H  . insulin aspart  0-20 Units Subcutaneous TID WC  . insulin glargine  22 Units Subcutaneous Daily  . mouth rinse  15 mL Mouth Rinse q12n4p  . Ensure Max Protein  11 oz Oral BID  . sodium chloride flush  3 mL Intravenous Q12H  . sodium chloride flush  3 mL Intravenous Q12H    Continuous Infusions: . sodium chloride Stopped (06/11/18 1431)     LOS: 8 days     Darlin Drop, MD [facility] Pager [phone]  If 7PM-7AM, please contact night-coverage [email or web address] Password [email or web address] 06/15/2018, 9:15 AM

## 2018-06-15 NOTE — Progress Notes (Signed)
Called Patient's wife Len Primas and gave updates on the phone.

## 2018-06-16 LAB — COMPREHENSIVE METABOLIC PANEL
ALT: 98 U/L — ABNORMAL HIGH (ref 0–44)
AST: 48 U/L — ABNORMAL HIGH (ref 15–41)
Albumin: 3.1 g/dL — ABNORMAL LOW (ref 3.5–5.0)
Alkaline Phosphatase: 70 U/L (ref 38–126)
Anion gap: 11 (ref 5–15)
BUN: 24 mg/dL — ABNORMAL HIGH (ref 6–20)
CO2: 25 mmol/L (ref 22–32)
Calcium: 8.4 mg/dL — ABNORMAL LOW (ref 8.9–10.3)
Chloride: 101 mmol/L (ref 98–111)
Creatinine, Ser: 1.02 mg/dL (ref 0.61–1.24)
GFR calc Af Amer: >60 mL/min (ref >60)
GFR calc non Af Amer: >60 mL/min (ref >60)
Glucose, Bld: 164 mg/dL — ABNORMAL HIGH (ref 70–99)
Potassium: 3.4 mmol/L — ABNORMAL LOW (ref 3.5–5.1)
Sodium: 137 mmol/L (ref 135–145)
Total Bilirubin: 1.2 mg/dL (ref 0.3–1.2)
Total Protein: 6.2 g/dL — ABNORMAL LOW (ref 6.5–8.1)

## 2018-06-16 LAB — GLUCOSE, CAPILLARY
Glucose-Capillary: 171 mg/dL — ABNORMAL HIGH (ref 70–99)
Glucose-Capillary: 197 mg/dL — ABNORMAL HIGH (ref 70–99)
Glucose-Capillary: 152 mg/dL — ABNORMAL HIGH (ref 70–99)

## 2018-06-16 MED ORDER — POTASSIUM CHLORIDE CRYS ER 20 MEQ PO TBCR
40.0000 meq | EXTENDED_RELEASE_TABLET | Freq: Two times a day (BID) | ORAL | Status: AC
  Administered 2018-06-16 (×2): 40 meq via ORAL
  Filled 2018-06-16 (×2): qty 2

## 2018-06-16 MED ORDER — PHENOL 1.4 % MT LIQD
1.0000 | OROMUCOSAL | Status: DC | PRN
  Administered 2018-06-16: 1 via OROMUCOSAL
  Filled 2018-06-16: qty 177

## 2018-06-16 NOTE — Progress Notes (Addendum)
Attempted to call update to pt wife, Andrey Campanile, voicemail left. [phone] wife updated.

## 2018-06-16 NOTE — TOC Progression Note (Signed)
Transition of Care [facility]) - Progression Note    Patient Details  Name: Adrian Mclean MRN: [id number] Date of Birth: May 17, 1960  [facility]) CM/SW Contact  Leone Haven, RN Phone Number: 06/16/2018, 3:03 PM  Clinical Narrative:    From home with spouse, pta indep, he has no problem with getting medications , he has PCP, wife will transport him at discharge, he has no DME at home.     Expected Discharge Plan: Home/Self Care Barriers to Discharge: No Barriers Identified  Expected Discharge Plan and Services Expected Discharge Plan: Home/Self Care In-house Referral: NA Discharge Planning Services: CM Consult Post Acute Care Choice: NA Living arrangements for the past 2 months: Single Family Home Expected Discharge Date: 06/16/18               DME Arranged: N/A DME Agency: NA HH Arranged: NA HH Agency: NA   Social Determinants of Health (SDOH) Interventions    Readmission Risk Interventions Readmission Risk Prevention Plan 06/16/2018  Transportation Screening Complete  Home Care Screening Complete  Medication Review (RN CM) Complete

## 2018-06-16 NOTE — Progress Notes (Signed)
PROGRESS NOTE  Alyan Luebke HFW:[id number] DOB: [birth year] DOA: 06/07/2018 PCP: [facility]  HPI/Recap of past 24 hours: 58 y/o male with PMH of DM who was diagnosed with COVID-19 on 4/3 who presented to [facility] with fever, cough, SOB and diarrhea.  Hydroxychloroquine was started and patient was transferred to [facility] for admission.  Patient was placed on 4L  and transferred to [facility].  PCCM was consulted on 4/8 for worsening hypoxemia.  Patient evidently finished a course of tamiflu, zithromax and cefdinir as outpatient with no improvement.  Started on plaquenil, rocephin and zithromax again in the hospital upon admission at 1 AM on 4/8, intubated on admit.  Intubated on 4/08 extubated 4/12. 4/13 To HFNC 10L. 4/15 O2 to 8L  Positive COVID-19.  Transferred to [facility] service on 06/15/2018.  06/16/18: Patient seen and examined at his bedside.  No acute events overnight.  States he feels weak but improving.  Hypoxic with O2 saturation 86% on room air improved with 4 L of oxygen supplementation to 96%.  Afebrile.  Reports worsening sore throat.  PRN Chloraseptic mouth spray ordered.  No sign of oral Candida noted on exam.  Extubated 5 days ago.   Assessment/Plan: Principal Problem:   Acute respiratory disease due to COVID-19 virus Active Problems:   Diabetes mellitus type II, non insulin dependent (HCC)   Acute respiratory failure with hypoxia (HCC)   ARDS (adult respiratory distress syndrome) (HCC)   COVID-19 virus detected   Acute respiratory distress syndrome (ARDS) due to COVID-19 virus   Pressure injury of skin  Resolving ARDS secondary to COVID-19 infection Extubated on 06/11/2018 Transferred to [facility] services on 06/15/2018 from PCCM Oxygen requirement continues to improve from 8 L to 5 L to 4 L to maintain O2 saturation greater than 90% Continue to maintain O2 saturation greater than 92% Continue round-the-clock inhalers Independently reviewed chest x-ray done on 06/12/2018 which  revealed bilateral patchy infiltrates worse on chest x-ray done on 06/11/2018. Did not repeat chest x-ray due to improving O2 supplementation demand  COVID-19 pneumonia Management as stated above Afebrile in the last 48 hours Elevated LDH, ferritin, d-dimer on presentation Extubated on 06/11/2018 Patient will transfer to [facility] to continue care  Acute hypoxic respiratory failure secondary to COVID-19 pneumonia Currently saturating 86% on room air and required liters of oxygen by nasal cannula to maintain O2 saturation greater than 90% Continue to maintain O2 saturation greater than 92% Continue round-the-clock inhalers  Odynophagia No clear sign of oral Candida on exam Started Chloraseptic mouth spray as needed  Type 2 diabetes with hyperglycemia Last A1c 8.9 from 06/14/2018 Continue insulin sliding scale Continue to hold home oral anti-glycemics  Elevated LFTs, resolving No history of liver disease Not on statin drug at home Possibly associated with his viral infection/COVID-19 Repeated LFTs have significantly trended down  Hypokalemia Potassium 3.4 Repleted with p.o. KCl 40 mEq x 2 Last magnesium level was 2.5 on 06/12/2018  Physical debility/generalized weakness Suspect from present illness/COVID-19 viral infection Physical therapy when able    Code Status: Full code  Family Communication: Updated wife on the phone on 06/15/2018  Disposition Plan: Home possibly in 1-2 days when hemodynamically stable or when oxygen saturation requirement less than 4 L by nasal cannula.    Consultants:  PCCM  Procedures:  Intubation and extubation  Antimicrobials:  None  DVT prophylaxis: Subcu Lovenox daily   Objective: Vitals:   06/15/18 2008 06/15/18 2300 06/16/18 0200 06/16/18 0400  BP: 133/81 (!) 142/73 Marland Kitchen)  106/57   Pulse: 67 73 82 63  Resp: 19 (!) 22 (!) 21 18  Temp:  98.2 F (36.8 C) 98.7 F (37.1 C)   TempSrc:  Oral Oral   SpO2: 95% 93% (!) 86% 96%  Weight:       Height:        Intake/Output Summary (Last 24 hours) at 06/16/2018 0915 Last data filed at 06/16/2018 0600 Gross per 24 hour  Intake 440 ml  Output -  Net 440 ml   Filed Weights   06/10/18 0300 06/11/18 0421 06/12/18 0500  Weight: 96.7 kg 96.5 kg 92.5 kg    Exam:  . General: 58 y.o. year-old male developed well-nourished in no acute distress.  Alert and oriented x3. . Cardiovascular: Regular rate and rhythm with no rubs or gallops.  No JVD or thyromegaly noted. Marland Kitchen Respiratory: Mild rales at bases with no wheezes noted.  Good inspiratory effort. . Abdomen: Obese, soft nontender upon palpation with normal bowel sounds x4 quadrant . Musculoskeletal: His lower extremities edema bilaterally . Psychiatry: Mood is appropriate for condition and setting.   Data Reviewed: CBC: Recent Labs  Lab 06/10/18 0250  06/11/18 0231  06/12/18 0326 06/12/18 0333 06/13/18 0400 06/14/18 0339 06/15/18 0336  WBC 4.8  --  5.6  --   --  8.5 9.1 9.1 8.8  NEUTROABS 3.2  --  4.0  --   --  6.2  --   --   --   HGB 13.2   < > 12.7*   < > 12.9* 13.9 14.8 13.7 13.4  HCT 39.6   < > 37.6*   < > 38.0* 42.5 44.3 41.8 39.3  MCV 95.2  --  95.2  --   --  94.0 95.5 95.7 93.8  PLT 231  --  252  --   --  271 304 294 284   < > = values in this interval not displayed.   Basic Metabolic Panel: Recent Labs  Lab 06/10/18 0250  06/11/18 0231  06/11/18 1126  06/12/18 0333 06/13/18 0400 06/14/18 0339 06/15/18 0336 06/16/18 0459  NA 143   < > 144   < > 147*   < > 149* 151* 142 139 137  K 3.9   < > 3.8   < > 3.4*   < > 3.7 4.2 3.4* 3.6 3.4*  CL 106  --  105  --  111  --  109 111 103 101 101  CO2 24  --  25  --  26  --  GLUCOSE 224*  --  324*  --  284*  --  212* 246* 198* 180* 164*  BUN 35*  --  39*  --  35*  --  34* 43* 41* 34* 24*  CREATININE 1.11  --  1.18  --  1.02  --  1.00 1.15 1.01 1.03 1.02  CALCIUM 8.6*  --  8.5*  --  8.4*  --  9.1 9.5 9.0 8.8* 8.4*  MG 2.5*  --  2.5*  --  2.5*  --   2.5*  --   --   --   --   PHOS  --   --  3.0  --  2.2*  --  2.6  --   --   --   --    < > = values in this interval not displayed.   GFR: Estimated Creatinine Clearance: 88.7 mL/min (by C-G formula based on SCr  of 1.02 mg/dL). Liver Function Tests: Recent Labs  Lab 06/10/18 0250 06/11/18 0231 06/12/18 0333 06/16/18 0459  AST 37 77* 124* 48*  ALT 40 74* 135* 98*  ALKPHOS 52 63 88 70  BILITOT 0.7 0.8 1.4* 1.2  PROT 5.9* 5.3* 6.7 6.2*  ALBUMIN 2.6* 2.7* 3.1* 3.1*   No results for input(s): LIPASE, AMYLASE in the last 168 hours. No results for input(s): AMMONIA in the last 168 hours. Coagulation Profile: No results for input(s): INR, PROTIME in the last 168 hours. Cardiac Enzymes: Recent Labs  Lab 06/10/18 0250 06/11/18 0231 06/12/18 0333  CKTOTAL 69 108 66   BNP (last 3 results) No results for input(s): PROBNP in the last 8760 hours. HbA1C: Recent Labs    06/14/18 0339  HGBA1C 8.9*   CBG: Recent Labs  Lab 06/14/18 2053 06/15/18 0807 06/15/18 1146 06/15/18 1652 06/16/18 0748  GLUCAP 190* 208* 199* 181* 171*   Lipid Profile: No results for input(s): CHOL, HDL, LDLCALC, TRIG, CHOLHDL, LDLDIRECT in the last 72 hours. Thyroid Function Tests: No results for input(s): TSH, T4TOTAL, FREET4, T3FREE, THYROIDAB in the last 72 hours. Anemia Panel: No results for input(s): VITAMINB12, FOLATE, FERRITIN, TIBC, IRON, RETICCTPCT in the last 72 hours. Urine analysis: No results found for: COLORURINE, APPEARANCEUR, LABSPEC, PHURINE, GLUCOSEU, HGBUR, BILIRUBINUR, KETONESUR, PROTEINUR, UROBILINOGEN, NITRITE, LEUKOCYTESUR Sepsis Labs: @LABRCNTIP (procalcitonin:4,lacticidven:4)  ) Recent Results (from the past 240 hour(s))  Blood culture (routine x 2)     Status: None   Collection Time: 06/06/18  6:43 PM  Result Value Ref Range Status   Specimen Description BLOOD BLOOD LEFT HAND  Final   Special Requests   Final    BOTTLES DRAWN AEROBIC AND ANAEROBIC Blood Culture adequate  volume   Culture   Final    NO GROWTH 5 DAYS Performed at [facility], [address] [place], [place] [address]    Report Status 06/11/2018 FINAL  Final  Blood culture (routine x 2)     Status: None   Collection Time: 06/06/18  6:43 PM  Result Value Ref Range Status   Specimen Description BLOOD BLOOD RIGHT FOREARM  Final   Special Requests   Final    BOTTLES DRAWN AEROBIC AND ANAEROBIC Blood Culture results may not be optimal due to an inadequate volume of blood received in culture bottles   Culture   Final    NO GROWTH 5 DAYS Performed at [facility], [address] [place], [place] [address]    Report Status 06/11/2018 FINAL  Final  MRSA PCR Screening     Status: None   Collection Time: 06/07/18 12:51 AM  Result Value Ref Range Status   MRSA by PCR NEGATIVE NEGATIVE Final    Comment:        The GeneXpert MRSA Assay (FDA approved for NASAL specimens only), is one component of a comprehensive MRSA colonization surveillance program. It is not intended to diagnose MRSA infection nor to guide or monitor treatment for MRSA infections. Performed at [facility], [address] [place], [place] [address]   Culture, respiratory (non-expectorated)     Status: None   Collection Time: 06/07/18  5:02 PM  Result Value Ref Range Status   Specimen Description TRACHEAL ASPIRATE  Final   Special Requests NONE  Final   Gram Stain   Final    MODERATE WBC PRESENT, PREDOMINANTLY PMN FEW GRAM POSITIVE COCCI FEW GRAM POSITIVE RODS RARE GRAM NEGATIVE RODS    Culture   Final  FEW Consistent with normal respiratory flora. Performed at [facility], [address] [place], [place] [address]    Report Status 06/10/2018 FINAL  Final      Studies: No results found.  Scheduled Meds: . albuterol  2 puff Inhalation Q6H  . chlorhexidine  15 mL Mouth Rinse BID  . enoxaparin (LOVENOX) injection  40 mg Subcutaneous Q24H  . insulin aspart  0-20 Units  Subcutaneous TID WC  . insulin glargine  22 Units Subcutaneous Daily  . mouth rinse  15 mL Mouth Rinse q12n4p  . potassium chloride  40 mEq Oral BID  . Ensure Max Protein  11 oz Oral BID  . sodium chloride flush  3 mL Intravenous Q12H  . sodium chloride flush  3 mL Intravenous Q12H    Continuous Infusions: . sodium chloride Stopped (06/11/18 1431)     LOS: 9 days     Darlin Drop, MD [facility] Pager [phone]  If 7PM-7AM, please contact night-coverage [email or web address] Password [email or web address] 06/16/2018, 9:15 AM

## 2018-06-16 NOTE — Progress Notes (Signed)
Patient was seen and examined upon transfer to [facility]. He was awake-following commands. Patient was seen by my partner Dr. Margo Aye earlier this morning at [facility] will continue with plans as outlined in her note.

## 2018-06-16 NOTE — Progress Notes (Signed)
Patient admitted to [facility] rm 165. Vitals stable, ccmd called and verified. Pt oriented x4. Oriented to room. Continue to monitor.

## 2018-06-17 LAB — CBC WITH DIFFERENTIAL/PLATELET
Abs Immature Granulocytes: 0.14 10*3/uL — ABNORMAL HIGH (ref 0.00–0.07)
Basophils Absolute: 0.1 10*3/uL (ref 0.0–0.1)
Basophils Relative: 1 %
Eosinophils Absolute: 0.2 10*3/uL (ref 0.0–0.5)
Eosinophils Relative: 2 %
HCT: 38.6 % — ABNORMAL LOW (ref 39.0–52.0)
Hemoglobin: 12.7 g/dL — ABNORMAL LOW (ref 13.0–17.0)
Immature Granulocytes: 2 %
Lymphocytes Relative: 29 %
Lymphs Abs: 2 10*3/uL (ref 0.7–4.0)
MCH: 31.2 pg (ref 26.0–34.0)
MCHC: 32.9 g/dL (ref 30.0–36.0)
MCV: 94.8 fL (ref 80.0–100.0)
Monocytes Absolute: 0.7 10*3/uL (ref 0.1–1.0)
Monocytes Relative: 10 %
Neutro Abs: 3.9 10*3/uL (ref 1.7–7.7)
Neutrophils Relative %: 56 %
Platelets: 244 10*3/uL (ref 150–400)
RBC: 4.07 MIL/uL — ABNORMAL LOW (ref 4.22–5.81)
RDW: 12 % (ref 11.5–15.5)
WBC: 7 10*3/uL (ref 4.0–10.5)
nRBC: 0 % (ref 0.0–0.2)

## 2018-06-17 LAB — GLUCOSE, CAPILLARY
Glucose-Capillary: 207 mg/dL — ABNORMAL HIGH (ref 70–99)
Glucose-Capillary: 270 mg/dL — ABNORMAL HIGH (ref 70–99)
Glucose-Capillary: 150 mg/dL — ABNORMAL HIGH (ref 70–99)
Glucose-Capillary: 146 mg/dL — ABNORMAL HIGH (ref 70–99)

## 2018-06-17 LAB — COMPREHENSIVE METABOLIC PANEL
ALT: 107 U/L — ABNORMAL HIGH (ref 0–44)
AST: 54 U/L — ABNORMAL HIGH (ref 15–41)
Albumin: 3.6 g/dL (ref 3.5–5.0)
Alkaline Phosphatase: 67 U/L (ref 38–126)
Anion gap: 7 (ref 5–15)
BUN: 21 mg/dL — ABNORMAL HIGH (ref 6–20)
CO2: 25 mmol/L (ref 22–32)
Calcium: 8.6 mg/dL — ABNORMAL LOW (ref 8.9–10.3)
Chloride: 105 mmol/L (ref 98–111)
Creatinine, Ser: 0.89 mg/dL (ref 0.61–1.24)
GFR calc Af Amer: >60 mL/min (ref >60)
GFR calc non Af Amer: >60 mL/min (ref >60)
Glucose, Bld: 199 mg/dL — ABNORMAL HIGH (ref 70–99)
Potassium: 4.2 mmol/L (ref 3.5–5.1)
Sodium: 137 mmol/L (ref 135–145)
Total Bilirubin: 1.2 mg/dL (ref 0.3–1.2)
Total Protein: 6.6 g/dL (ref 6.5–8.1)

## 2018-06-17 MED ORDER — OPTICHAMBER DIAMOND MISC
1.0000 | Freq: Once | Status: AC
  Administered 2018-06-17: 1
  Filled 2018-06-17: qty 1

## 2018-06-17 NOTE — Progress Notes (Signed)
PROGRESS NOTE  Adrian AlarDanny Mclean  WUJ:[id number] DOB: 02-12-1961 DOA: 06/07/2018 [facility]   Brief Narrative: Adrian Mclean is a 58 y.o. male with a history of T2DM who [facility]/8 on the advice of his PCP with worsening fever, cough, dyspnea and diarrhea. He had been diagnosed with covid-19 on 4/3 and discharged home with tamiflu, azithromycin, and cefdinir but returned with worsening symptoms, found to be hypoxic. [facility] was [facility] and intubated, treated for ARDS with extubation and transition to HFNC on 4/12. Respiratory status slowly improved, transferred to medical floor 4/16, and supplemental oxygen weaned to 4L. [facility], and he remains hypoxic.  Assessment & Plan: Principal Problem:   Acute respiratory disease due to COVID-19 virus Active Problems:   Diabetes mellitus type II, non insulin dependent (HCC)   Acute respiratory failure with hypoxia (HCC)   ARDS (adult respiratory distress syndrome) (HCC)   COVID-19 virus detected   Acute respiratory distress syndrome (ARDS) due to COVID-19 virus   Pressure injury of skin  ARDS, acute hypoxic respiratory failure due to covid-19 pneumonia: Extubated 4/12, hypoxia improving but remains short of breath and hypoxic. Has completed ceftriaxone, azithromycin, HCQ, and received actemra. - Supplemental oxygen to maintain SpO2 >90%, may need to DC on O2 - MDI scheduled to be administered by patient and prn. No current wheezing. - Avoid NSAIDs  Generalized weakness: Due to protracted severe illness.  - PT evaluation ordered.  T2DM, uncontrolled with hyperglycemia: HbA1c 8.9%. - Continue lantus, resistant SSI; will need PCP follow up. - Holding po home medications  LFT elevation: Most likely due to viral illness. Improving.  - Recheck at follow up to guide decision on further work up.   Hypokalemia: Replaced.  - Resolved  Obesity: BMI 31.  - Weight loss recommended  Stage 1 pressure  injury to buttocks in midline: Not POA, first noted 4/10.  - Offload as able.   Sore throat: Improving  DVT prophylaxis: Lovenox Code Status: Full Family Communication: None at bedside. Disposition Plan: Home pending PT evaluation and trends in respiratory status. Possibly 24-48 hours.  Consultants:   CCM  Procedures:   Intubation 4/8 - 4/12  Antimicrobials:  Ceftriaxone, azithromycin, HCQ.   Subjective: Short of breath, no chest pain. Feels he's getting better but very diffusely weak.   Objective: Vitals:   06/17/18 1200 06/17/18 1355 06/17/18 1358 06/17/18 1359  BP: 137/78     Pulse: 84     Resp:      Temp:      TempSrc:      SpO2:  96% 90% 90%  Weight:      Height:        Intake/Output Summary (Last 24 hours) at 06/17/2018 1410 Last data filed at 06/17/2018 0600 Gross per 24 hour  Intake 240 ml  Output 850 ml  Net -610 ml   Filed Weights   06/11/18 0421 06/12/18 0500 06/16/18 1540  Weight: 96.5 kg 92.5 kg 96.1 kg    Gen: 58 y.o. male in no distress  Pulm: Non-labored breathing supplemental oxygen. Clear, limited air movement. CV: Regular rate and rhythm. No murmur, rub, or gallop. No JVD, no pedal edema. GI: Abdomen soft, non-tender, non-distended, with normoactive bowel sounds. No organomegaly or masses felt. Ext: Warm, no deformities Skin: No rashes, lesions no ulcers Neuro: Alert and oriented. No focal neurological deficits. Psych: Judgement and insight appear normal. Mood & affect appropriate.   Data Reviewed: I have personally  reviewed following labs and imaging studies  CBC: Recent Labs  Lab 06/11/18 0231  06/12/18 0333 06/13/18 0400 06/14/18 0339 06/15/18 0336 06/17/18 0620  WBC 5.6  --  8.5 9.1 9.1 8.8 7.0  NEUTROABS 4.0  --  6.2  --   --   --  3.9  HGB 12.7*   < > 13.9 14.8 13.7 13.4 12.7*  HCT 37.6*   < > 42.5 44.3 41.8 39.3 38.6*  MCV 95.2  --  94.0 95.5 95.7 93.8 94.8  PLT 252  --  271 304 294 284 244   < > = values in this  interval not displayed.   Basic Metabolic Panel: Recent Labs  Lab 06/11/18 0231  06/11/18 1126  06/12/18 0333 06/13/18 0400 06/14/18 0339 06/15/18 0336 06/16/18 0459 06/17/18 0620  NA 144   < > 147*   < > 149* 151* 142 139 137 137  K 3.8   < > 3.4*   < > 3.7 4.2 3.4* 3.6 3.4* 4.2  CL 105  --  111  --  109 111 103 101 101 105  CO2 25  --  26  --  27 31 29 27 25 25   GLUCOSE 324*  --  284*  --  212* 246* 198* 180* 164* 199*  BUN 39*  --  35*  --  34* 43* 41* 34* 24* 21*  CREATININE 1.18  --  1.02  --  1.00 1.15 1.01 1.03 1.02 0.89  CALCIUM 8.5*  --  8.4*  --  9.1 9.5 9.0 8.8* 8.4* 8.6*  MG 2.5*  --  2.5*  --  2.5*  --   --   --   --   --   PHOS 3.0  --  2.2*  --  2.6  --   --   --   --   --    < > = values in this interval not displayed.   GFR: Estimated Creatinine Clearance: 103.5 mL/min (by C-G formula based on SCr of 0.89 mg/dL). Liver Function Tests: Recent Labs  Lab 06/11/18 0231 06/12/18 0333 06/16/18 0459 06/17/18 0620  AST 77* 124* 48* 54*  ALT 74* 135* 98* 107*  ALKPHOS 63 88 70 67  BILITOT 0.8 1.4* 1.2 1.2  PROT 5.3* 6.7 6.2* 6.6  ALBUMIN 2.7* 3.1* 3.1* 3.6   No results for input(s): LIPASE, AMYLASE in the last 168 hours. No results for input(s): AMMONIA in the last 168 hours. Coagulation Profile: No results for input(s): INR, PROTIME in the last 168 hours. Cardiac Enzymes: Recent Labs  Lab 06/11/18 0231 06/12/18 0333  CKTOTAL 108 66   BNP (last 3 results) No results for input(s): PROBNP in the last 8760 hours. HbA1C: No results for input(s): HGBA1C in the last 72 hours. CBG: Recent Labs  Lab 06/16/18 0748 06/16/18 1127 06/16/18 1751 06/17/18 0738 06/17/18 1109  GLUCAP 171* 197* 152* 207* 270*   Lipid Profile: No results for input(s): CHOL, HDL, LDLCALC, TRIG, CHOLHDL, LDLDIRECT in the last 72 hours. Thyroid Function Tests: No results for input(s): TSH, T4TOTAL, FREET4, T3FREE, THYROIDAB in the last 72 hours. Anemia Panel: No results for  input(s): VITAMINB12, FOLATE, FERRITIN, TIBC, IRON, RETICCTPCT in the last 72 hours. Urine analysis: No results found for: COLORURINE, APPEARANCEUR, LABSPEC, PHURINE, GLUCOSEU, HGBUR, BILIRUBINUR, KETONESUR, PROTEINUR, UROBILINOGEN, NITRITE, LEUKOCYTESUR Recent Results (from the past 240 hour(s))  Culture, respiratory (non-expectorated)     Status: None   Collection Time: 06/07/18  5:02 PM  Result Value  Ref Range Status   Specimen Description TRACHEAL ASPIRATE  Final   Special Requests NONE  Final   Gram Stain   Final    MODERATE WBC PRESENT, PREDOMINANTLY PMN FEW GRAM POSITIVE COCCI FEW GRAM POSITIVE RODS RARE GRAM NEGATIVE RODS    Culture   Final    FEW Consistent with normal respiratory flora. Performed at [facility], [address] [place], [place] [address]    Report Status 06/10/2018 FINAL  Final      Radiology Studies: No results found.  Scheduled Meds: . albuterol  2 puff Inhalation Q6H  . chlorhexidine  15 mL Mouth Rinse BID  . enoxaparin (LOVENOX) injection  40 mg Subcutaneous Q24H  . insulin aspart  0-20 Units Subcutaneous TID WC  . insulin glargine  22 Units Subcutaneous Daily  . mouth rinse  15 mL Mouth Rinse q12n4p  . Ensure Max Protein  11 oz Oral BID  . sodium chloride flush  3 mL Intravenous Q12H   Continuous Infusions: . sodium chloride Stopped (06/11/18 1431)     LOS: 10 days   Time spent: 25 minutes.  Tyrone Nine, MD [facility] [email or web address] Password [email or web address] 06/17/2018, 2:10 PM

## 2018-06-18 LAB — GLUCOSE, CAPILLARY
Glucose-Capillary: 246 mg/dL — ABNORMAL HIGH (ref 70–99)
Glucose-Capillary: 210 mg/dL — ABNORMAL HIGH (ref 70–99)

## 2018-06-18 MED ORDER — ALBUTEROL SULFATE HFA 108 (90 BASE) MCG/ACT IN AERS
2.0000 | INHALATION_SPRAY | Freq: Three times a day (TID) | RESPIRATORY_TRACT | Status: DC
  Administered 2018-06-18: 2 via RESPIRATORY_TRACT
  Filled 2018-06-18: qty 6.7

## 2018-06-18 NOTE — Discharge Summary (Signed)
Physician Discharge Summary  Adrian Mclean BTD:[id number] DOB: 1960/08/06 DOA: 06/07/2018  PCP: [facility]  Admit date: 06/07/2018 Discharge date: 06/18/2018  Admitted From: Home Disposition: Home   Recommendations for Outpatient Follow-up:  1. Follow up with PCP in the next week. May need augmentation of diabetes medications if hyperglycemia continues. No new medications prescribed at discharge to avoid hypoglycemia in the event that inflammation-driven hyperglycemia resolves. 2. Please obtain CMP/CBC in one week  Home Health: None recommended by PT Equipment/Devices: 2L O2 Discharge Condition: Stable, improved. CODE STATUS: Full Diet recommendation: Carb-modified  Brief/Interim Summary: Adrian Mclean is a 58 y.o. male with a history of T2DM who presented to [facility] 4/8 on the advice of his PCP with worsening fever, cough, dyspnea and diarrhea. He had been diagnosed with covid-19 on 4/3 and discharged home with tamiflu, azithromycin, and cefdinir but returned with worsening symptoms, found to be hypoxic. He was transferred to [facility] and intubated, treated for ARDS with extubation and transition to HFNC on 4/12. Respiratory status slowly improved, transferred to medical floor 4/16, and supplemental oxygen weaned to 4L. Transfer to [facility] initiated 4/17, and continues to improve, now requiring 2L supplemental oxygen and reporting continued decrease in dyspnea.   Discharge Diagnoses:  Principal Problem:   Acute respiratory disease due to COVID-19 virus Active Problems:   Diabetes mellitus type II, non insulin dependent (HCC)   Acute respiratory failure with hypoxia (HCC)   ARDS (adult respiratory distress syndrome) (HCC)   COVID-19 virus detected   Acute respiratory distress syndrome (ARDS) due to COVID-19 virus   Pressure injury of skin  ARDS, acute hypoxic respiratory failure due to covid-19 pneumonia: Extubated 4/12, hypoxia improving but remains short of breath and hypoxic. Has  completed ceftriaxone, azithromycin, HCQ, and received actemra. - Will discharge with supplemental oxygen.  - continue albuterol at home. No current wheezing. - Avoid NSAIDs  Generalized weakness: Due to protracted severe illness.  - PT evaluation prior to discharge: Per them, patient evaluated by Physical Therapy with no further acute PT needs identified. All education has been completed and the patient has no further questions. Pt mobilizing well today, VSS other than reliant on O2 to maintain adequate O2 saturations while amb  T2DM, uncontrolled with hyperglycemia: HbA1c 8.9% indicating average CBG >200mg /dl. Was given insulin (lantus and novolog) while admitted. Considered discharging on lantus as CBG's are up with infection, though the patient reports having widely erratic blood sugars in the setting of infections in the past and would prefer to avoid hypoglycemia in a pt who would be new to insulin.  - Continue home medications but urged very close PCP follow up.   LFT elevation: Most likely due to viral illness. Improving.  - Recheck at follow up to guide decision on further work up.   Hypokalemia: Replaced.  - Resolved  Obesity: BMI 31.  - Weight loss recommended  Stage 1 pressure injury to buttocks in midline: Not POA, first noted 4/10.  - Offload as able.   Sore throat: Improving  Discharge Instructions Discharge Instructions    Diet Carb Modified   Complete by:  As directed    Discharge instructions   Complete by:  As directed    Please follow up with your doctor very closely. Your symptoms are expected to continue to improve, but if they get worse seek medical attention right away. Check your blood sugars and follow a low carbohydrate diet, drink plenty of fluids. Your blood sugars are expected to be erratic, higher  than usual, but if they are over 300 regularly you will need to start additional medications. Please make sure this is follow up on by your doctor.    Increase activity slowly   Complete by:  As directed      Allergies as of 06/18/2018      Reactions   Codeine Hives, Other (See Comments)   My body does not like it      Medication List    TAKE these medications   acetaminophen 500 MG tablet Commonly known as:  TYLENOL Take 500 mg by mouth every 6 (six) hours as needed for fever.   albuterol 108 (90 Base) MCG/ACT inhaler Commonly known as:  VENTOLIN HFA Inhale 2 puffs into the lungs every 4 (four) hours as needed for shortness of breath.   benzonatate 200 MG capsule Commonly known as:  TESSALON Take 200 mg by mouth 3 (three) times daily as needed for cough.   fluticasone 50 MCG/ACT nasal spray Commonly known as:  FLONASE Place 2 sprays into both nostrils as needed for allergies.   glipiZIDE 5 MG tablet Commonly known as:  GLUCOTROL Take 5 mg by mouth 2 (two) times daily.   metFORMIN 1000 MG tablet Commonly known as:  GLUCOPHAGE Take 1,000 mg by mouth 2 (two) times daily with a meal.            Durable Medical Equipment  (From admission, onward)         Start     Ordered   06/18/18 1342  DME Oxygen  Once    Question Answer Comment  Mode or (Route) Nasal cannula   Liters per Minute 2   Frequency Continuous (stationary and portable oxygen unit needed)   Oxygen delivery system Gas      06/18/18 1346          Allergies  Allergen Reactions  . Codeine Hives and Other (See Comments)    My body does not like it    Consultations:  PCCM  Procedures/Studies: Dg Abd 1 View  Result Date: 06/07/2018 CLINICAL DATA:  OG tube placement. EXAM: ABDOMEN - 1 VIEW COMPARISON:  None. FINDINGS: 1717 hours. Single supine portable view of the left upper abdomen shows an NG tube in situ. OG tube tip is in the distal stomach. Visualized abdomen demonstrates nonspecific gas pattern. IMPRESSION: OG tube tip is in the distal stomach. Electronically Signed   By: Kennith Center M.D.   On: 06/07/2018 18:28   Dg Chest Port 1  View  Result Date: 06/12/2018 CLINICAL DATA:  Respiratory failure. EXAM: PORTABLE CHEST 1 VIEW COMPARISON:  Chest x-ray from yesterday. FINDINGS: Interval removal of the endotracheal and enteric tubes. Unchanged left subclavian central venous catheter. Stable cardiomediastinal silhouette. Increased consolidation of the previously seen patchy, asymmetric peripheral opacities in both mid to lower lungs. No pleural effusion or pneumothorax. No acute osseous abnormality. IMPRESSION: 1. Progressive consolidation of the patchy, asymmetric peripheral opacities in both lungs, consistent with viral pneumonia. Electronically Signed   By: Obie Dredge M.D.   On: 06/12/2018 07:49   Dg Chest Port 1 View  Result Date: 06/11/2018 CLINICAL DATA:  Respiratory failure, COVID-19 positive EXAM: PORTABLE CHEST 1 VIEW COMPARISON:  Chest radiograph from one day prior. FINDINGS: Endotracheal tube tip is 7.5 cm above the carina. Enteric tube enters stomach with the tip not seen on this image. Left subclavian central venous catheter terminates in lower third of superior vena cava. Stable cardiomediastinal silhouette with top-normal heart size. No pneumothorax.  No pleural effusion. Extensive patchy opacity throughout the mid to lower lungs bilaterally, significantly worsened. IMPRESSION: 1. Support structures as detailed. 2. Significant worsening of extensive patchy opacity throughout the bilateral mid to lower lungs compatible with viral pneumonia. Electronically Signed   By: Delbert Phenix M.D.   On: 06/11/2018 08:20   Dg Chest Port 1 View  Result Date: 06/10/2018 CLINICAL DATA:  Ventilator dependent respiratory failure. Follow-up pneumonia. EXAM: PORTABLE CHEST 1 VIEW COMPARISON:  06/09/2018 and earlier. FINDINGS: Endotracheal tube tip just below the thoracic inlet, approximately 13 cm above the carina. LEFT subclavian central venous catheter tip projects over the mid SVC, unchanged. Improved aeration in both lungs since  yesterday, with only mild hazy and patchy airspace opacities persisting. No new abnormalities. IMPRESSION: 1. Endotracheal tube tip just below the thoracic inlet approximately 13 cm above the carina. This could be advanced 3-4 cm. Remaining support apparatus satisfactory. 2. Improved aeration in both lungs since yesterday, with only mild hazy and patchy airspace opacities persisting. 3. No new abnormalities. These results will be called to the ordering clinician or representative by the Radiologist Assistant, and communication documented in the PACS or zVision Dashboard. Electronically Signed   By: Hulan Saas M.D.   On: 06/10/2018 08:07   Dg Chest Port 1 View  Result Date: 06/09/2018 CLINICAL DATA:  Ventilator support.  COVID-19 patient. EXAM: PORTABLE CHEST 1 VIEW COMPARISON:  06/07/2018 FINDINGS: Endotracheal tube tip is 4 cm above the carina. Orogastric or nasogastric tube enters the abdomen. Left subclavian central line tip is in the SVC above the right atrium. Bilateral patchy pulmonary infiltrates persist, with slight worsening in the lower lobes. No dense consolidation, lobar collapse or effusion. IMPRESSION: Persistent bilateral hazy/patchy pulmonary infiltrates, with some worsening at both lung bases. Electronically Signed   By: Paulina Fusi M.D.   On: 06/09/2018 06:15   Dg Chest Port 1 View  Result Date: 06/07/2018 CLINICAL DATA:  Ventilator dependence. EXAM: PORTABLE CHEST 1 VIEW COMPARISON:  06/07/2018 FINDINGS: 1717 hours. Endotracheal tube tip is 6.0 cm above the base of the carina. Left subclavian central line tip overlies the distal SVC level. Cardiopericardial silhouette is at upper limits of normal for size. Similar appearance bilateral parahilar airspace opacity with peripheral sparing. No evidence for pneumothorax. No pleural effusion. The visualized bony structures of the thorax are intact. Insert wire IMPRESSION: 1. Interval intubation and left subclavian central line placement. 2.  Similar appearance of the central predominant bilateral parahilar airspace disease. Electronically Signed   By: Kennith Center M.D.   On: 06/07/2018 18:30   Dg Chest Port 1 View  Result Date: 06/07/2018 CLINICAL DATA:  ARDS. EXAM: PORTABLE CHEST 1 VIEW COMPARISON:  Prior exams, most recent dated 06/06/2018 FINDINGS: No change in the mid to lower lung airspace opacities compared to the most recent prior exam. No new lung abnormalities. No convincing pleural effusion.  No pneumothorax. Cardiac silhouette is normal in size. IMPRESSION: 1. No interval change in the bilateral airspace lung opacities when compared to the most recent prior exam. No new abnormalities. Electronically Signed   By: Amie Portland M.D.   On: 06/07/2018 16:19   Dg Chest Portable 1 View  Result Date: 06/06/2018 CLINICAL DATA:  Increasing shortness of breath. Recent diagnosis of COVID-19. EXAM: PORTABLE CHEST 1 VIEW COMPARISON:  Radiograph 06/02/2018 FINDINGS: Progressive bilateral airspace opacities in the peripheral and mid lower lung zone predominance. Lung volumes are low. Unchanged heart size and mediastinal contours. No large pleural effusion or pneumothorax.  No acute osseous abnormalities. IMPRESSION: Worsening bilateral airspace opacities over the past 4 days. Pattern consistent with diagnosis of COVID-19. Electronically Signed   By: Narda RutherfordMelanie  Sanford M.D.   On: 06/06/2018 18:59   Dg Chest Port 1 View  Result Date: 06/02/2018 CLINICAL DATA:  Body aches 1 week which shortness-of-breath. EXAM: PORTABLE CHEST 1 VIEW COMPARISON:  None. FINDINGS: Lungs are hypoinflated demonstrate subtle patchy hazy airspace density bilaterally. No evidence of effusion or pneumothorax. Borderline cardiomegaly. Bones and soft tissues are normal. IMPRESSION: Subtle hazy patchy bilateral airspace process likely due to infection. Electronically Signed   By: Elberta Fortisaniel  Boyle M.D.   On: 06/02/2018 17:29    Intubation 4/8 - 4/12  Subjective: Feeling better  everyday. Dyspnea improved, weakness improving. No chest pain or other complaints. Last febrile 4/8.  Discharge Exam: Vitals:   06/18/18 0759 06/18/18 0800  BP: 131/71 131/71  Pulse: 73   Resp:    Temp: 98.5 F (36.9 C)   SpO2: 93%    General: Pt is alert, awake, not in acute distress Cardiovascular: RRR, S1/S2 +, no rubs, no gallops Respiratory: Nonlabored on 2L O2, clear bilaterally Abdominal: Soft, NT, ND, bowel sounds + Extremities: No edema, no cyanosis. Reported dysesthesia in small area anterolaterally proximal to right patella without visible or palpable abnormality.    Labs: BNP (last 3 results) Recent Labs    06/06/18 1838  BNP 20.0   Basic Metabolic Panel: Recent Labs  Lab 06/12/18 0333 06/13/18 0400 06/14/18 0339 06/15/18 0336 06/16/18 0459 06/17/18 0620  NA 149* 151* 142 139 137 137  K 3.7 4.2 3.4* 3.6 3.4* 4.2  CL 109 111 103 101 101 105  CO2 27 31 29 27 25 25   GLUCOSE 212* 246* 198* 180* 164* 199*  BUN 34* 43* 41* 34* 24* 21*  CREATININE 1.00 1.15 1.01 1.03 1.02 0.89  CALCIUM 9.1 9.5 9.0 8.8* 8.4* 8.6*  MG 2.5*  --   --   --   --   --   PHOS 2.6  --   --   --   --   --    Liver Function Tests: Recent Labs  Lab 06/12/18 0333 06/16/18 0459 06/17/18 0620  AST 124* 48* 54*  ALT 135* 98* 107*  ALKPHOS 88 70 67  BILITOT 1.4* 1.2 1.2  PROT 6.7 6.2* 6.6  ALBUMIN 3.1* 3.1* 3.6   CBC: Recent Labs  Lab 06/12/18 0333 06/13/18 0400 06/14/18 0339 06/15/18 0336 06/17/18 0620  WBC 8.5 9.1 9.1 8.8 7.0  NEUTROABS 6.2  --   --   --  3.9  HGB 13.9 14.8 13.7 13.4 12.7*  HCT 42.5 44.3 41.8 39.3 38.6*  MCV 94.0 95.5 95.7 93.8 94.8  PLT 271 304 294 284 244   Cardiac Enzymes: Recent Labs  Lab 06/12/18 0333  CKTOTAL 66   CBG: Recent Labs  Lab 06/17/18 1109 06/17/18 1601 06/17/18 2147 06/18/18 0846 06/18/18 1234  GLUCAP 270* 150* 146* 246* 210*    Time coordinating discharge: Approximately 40 minutes  Tyrone Nineyan B Grunz, MD  [facility] 06/18/2018, 1:47 PM

## 2018-06-18 NOTE — Discharge Instructions (Signed)
You are stable for discharge with the following recommendations:  - You may continue working from home as your symptoms allow. You may not return to work/leave the home until at least 7 days since symptom onset AND 3 days without a fever (without taking tylenol, ibuprofen, etc.) AND have improvement in respiratory symptoms. This is based on the CDC's non-test criteria for ending self-isolation.  Wear a facemask You should wear a facemask that covers your nose and mouth when you are in the same room with other people and when you visit a healthcare provider. People who live with or visit you should also wear a facemask while they are in the same room with you.  Separate yourself from other people in your home As much as possible, you should stay in a different room from other people in your home. Also, you should use a separate bathroom, if available.  Avoid sharing household items You should not share dishes, drinking glasses, cups, eating utensils, towels, bedding, or other items with other people in your home. After using these items, you should wash them thoroughly with soap and water.  Cover your coughs and sneezes Cover your mouth and nose with a tissue when you cough or sneeze, or you can cough or sneeze into your sleeve. Throw used tissues in a lined trash can, and immediately wash your hands with soap and water for at least 20 seconds or use an alcohol-based hand rub.  Wash your [employer] Corporationhands Wash your hands often and thoroughly with soap and water for at least 20 seconds. You can use an alcohol-based hand sanitizer if soap and water are not available and if your hands are not visibly dirty. Avoid touching your eyes, nose, and mouth with unwashed hands.   Prevention Steps for Caregivers and Household Members of Individuals Confirmed to have, or Being Evaluated for, COVID-19 Infection Being Cared for in the Home  If you live with, or provide care at home for, a person confirmed to have,  or being evaluated for, COVID-19 infection please follow these guidelines to prevent infection:   Limit the number of people who have contact with the patient  If possible, have only one caregiver for the patient.  Other household members should stay in another home or place of residence. If this is not possible, they should stay  in another room, or be separated from the patient as much as possible. Use a separate bathroom, if available.  Restrict visitors who do not have an essential need to be in the home.  Ensure good ventilation Make sure that shared spaces in the home have good air flow, such as from an air conditioner or an opened window, weather permitting.  Wash your hands often  Wash your hands often and thoroughly with soap and water for at least 20 seconds. You can use an alcohol based hand sanitizer if soap and water are not available and if your hands are not visibly dirty.  Avoid touching your eyes, nose, and mouth with unwashed hands.  Use disposable paper towels to dry your hands. If not available, use dedicated cloth towels and replace them when they become wet.  Wear a facemask and gloves  Wear a disposable facemask at all times in the room and gloves when you touch or have contact with the patients blood, body fluids, and/or secretions or excretions, such as sweat, saliva, sputum, nasal mucus, vomit, urine, or feces.  Ensure the mask fits over your nose and mouth tightly, and do not  touch it during use.  Throw out disposable facemasks and gloves after using them. Do not reuse.  Wash your hands immediately after removing your facemask and gloves.  If your personal clothing becomes contaminated, carefully remove clothing and launder. Wash your hands after handling contaminated clothing.  Place all used disposable facemasks, gloves, and other waste in a lined container before disposing them with other household waste.  Remove gloves and wash your hands  immediately after handling these items.  Do not share dishes, glasses, or other household items with the patient  Avoid sharing household items. You should not share dishes, drinking glasses, cups, eating utensils, towels, bedding, or other items with a patient who is confirmed to have, or being evaluated for, COVID-19 infection.  After the person uses these items, you should wash them thoroughly with soap and water.  Wash laundry thoroughly  Immediately remove and wash clothes or bedding that have blood, body fluids, and/or secretions or excretions, such as sweat, saliva, sputum, nasal mucus, vomit, urine, or feces, on them.  Wear gloves when handling laundry from the patient.  Read and follow directions on labels of laundry or clothing items and detergent. In general, wash and dry with the warmest temperatures recommended on the label.  Clean all areas the individual has used often  Clean all touchable surfaces, such as counters, tabletops, doorknobs, bathroom fixtures, toilets, phones, keyboards, tablets, and bedside tables, every day. Also, clean any surfaces that may have blood, body fluids, and/or secretions or excretions on them.  Wear gloves when cleaning surfaces the patient has come in contact with.  Use a diluted bleach solution (e.g., dilute bleach with 1 part bleach and 10 parts water) or a household disinfectant with a label that says EPA-registered for coronaviruses. To make a bleach solution at home, add 1 tablespoon of bleach to 1 quart (4 cups) of water. For a larger supply, add  cup of bleach to 1 gallon (16 cups) of water.  Read labels of cleaning products and follow recommendations provided on product labels. Labels contain instructions for safe and effective use of the cleaning product including precautions you should take when applying the product, such as wearing gloves or eye protection and making sure you have good ventilation during use of the product.  Remove  gloves and wash hands immediately after cleaning.  Monitor yourself for signs and symptoms of illness Caregivers and household members are considered close contacts, should monitor their health, and will be asked to limit movement outside of the home to the extent possible. Follow the monitoring steps for close contacts listed on the symptom monitoring form.   ? If you have additional questions, contact your local health department or call the epidemiologist on call at [phone] (available 24/7). ? This guidance is subject to change. For the most up-to-date guidance from [facility], please refer to their website: [email or web address].hu

## 2018-06-18 NOTE — Evaluation (Signed)
Physical Therapy Evaluation Patient Details Name: Adrian Mclean MRN: [id number] DOB: 1960-08-14 Today's Date: 06/18/2018   History of Present Illness  Adrian Mclean is a 58 y.o. male with a history of T2DM who presented to [facility] 4/8 on the advice of his PCP with worsening fever, cough, dyspnea and diarrhea. He had been diagnosed with covid-19 on 4/3 and discharged home with tamiflu, azithromycin, and cefdinir but returned with worsening symptoms, found to be hypoxic. He was transferred to [facility] and intubated, treated for ARDS with extubation and transition to [facility]NC on 4/12. Respiratory status slowly improved, transferred to medical floor 4/16, and supplemental oxygen weaned to 4L. Transfer to [facility] initiated 4/17, and he remains hypoxic.  Clinical Impression  Patient evaluated by Physical Therapy with no further acute PT needs identified. All education has been completed and the patient has no further questions.   Pt mobilizing well today, VSS other than reliant on O2 to maintain adequate O2 saturations while amb; RN placed note for PT; discussed importance of incr activity level at home/mobilizing every hour, monitoring level of exertion; pt verbalizes understanding; no further needs from PT standpoint;   See below for any follow-up Physical Therapy or equipment needs. PT is signing off. Thank you for this referral.     Follow Up Recommendations No PT follow up    Equipment Recommendations  None recommended by PT;Other (comment)(O2)    Recommendations for Other Services       Precautions / Restrictions Precautions Precautions: Fall Restrictions Weight Bearing Restrictions: No      Mobility  Bed Mobility Overal bed mobility: Modified Independent                Transfers Overall transfer level: Needs assistance   Transfers: Sit to/from Stand Sit to Stand: Modified independent (Device/Increase time);Supervision         General transfer comment: initial supervision for lines  and safety, mod I end of session, no physical assist from bed or chair  Ambulation/Gait Ambulation/Gait assistance: Min guard;Supervision Gait Distance (Feet): 150 Feet Assistive device: None Gait Pattern/deviations: Step-through pattern;Decreased stride length     General Gait Details: pt with steady gait over level, smooth surface, LOB x1 with mod perturbations, light min assist  to recover; SpO2=89-90% on RA at rest, SpO2 = 82% on RA with amb,  up to 3L to maintain sats >95%  Stairs            Wheelchair Mobility    Modified Rankin (Stroke Patients Only)       Balance Overall balance assessment: Needs assistance Sitting-balance support: No upper extremity supported;Feet supported Sitting balance-Leahy Scale: Normal       Standing balance-Leahy Scale: Good Standing balance comment: LOB x1 with mod perturbations, light min assist to recover             High level balance activites: Side stepping;Backward walking;Direction changes;Turns;Head turns High Level Balance Comments: no LOB             Pertinent Vitals/Pain Pain Assessment: No/denies pain    Home Living Family/patient expects to be discharged to:: Private residence Living Arrangements: Spouse/significant other Available Help at Discharge: Family Type of Home: House       Home Layout: One level Home Equipment: Emergency planning/management officer - 2 wheels;Cane - single point      Prior Function Level of Independence: Independent               Hand Dominance        Extremity/Trunk  Assessment   Upper Extremity Assessment Upper Extremity Assessment: Overall WFL for tasks assessed    Lower Extremity Assessment Lower Extremity Assessment: Overall WFL for tasks assessed RLE Sensation: (pt reports dimished sensation as well as burning right ant thigh)       Communication   Communication: No difficulties  Cognition Arousal/Alertness: Awake/alert Behavior During Therapy: WFL for tasks  assessed/performed Overall Cognitive Status: Within Functional Limits for tasks assessed                                        General Comments      Exercises     Assessment/Plan    PT Assessment Patent does not need any further PT services  PT Problem List         PT Treatment Interventions      PT Goals (Current goals can be found in the Care Plan section)  Acute Rehab PT Goals Patient Stated Goal: to go home and get stronger PT Goal Formulation: All assessment and education complete, DC therapy    Frequency     Barriers to discharge        Co-evaluation               AM-PAC PT "6 Clicks" Mobility  Outcome Measure Help needed turning from your back to your side while in a flat bed without using bedrails?: None Help needed moving from lying on your back to sitting on the side of a flat bed without using bedrails?: None Help needed moving to and from a bed to a chair (including a wheelchair)?: None Help needed standing up from a chair using your arms (e.g., wheelchair or bedside chair)?: None Help needed to walk in hospital room?: A Little Help needed climbing 3-5 steps with a railing? : A Little 6 Click Score: 22    End of Session Equipment Utilized During Treatment: Gait belt Activity Tolerance: Patient tolerated treatment well Patient left: in chair;with call bell/phone within reach Nurse Communication: Mobility status PT Visit Diagnosis: Unsteadiness on feet (R26.81)    Time: [id number] PT Time Calculation (min) (ACUTE ONLY): 20 min   Charges:   PT Evaluation $PT Eval Low Complexity: 1 Low          Drucilla Chaletara Clevland Cork, PT  Pager: [phone] Acute Rehab Dept [facility]): [phone]   06/18/2018   [facility] CaliforniaWILLIAMS,Vander Adrian Mclean 06/18/2018, 5:30 PM

## 2018-06-18 NOTE — Progress Notes (Signed)
SATURATION QUALIFICATIONS: (This note is used to comply with regulatory documentation for home oxygen)  Patient Saturations on Room Air at Rest = 90%  Patient Saturations on Room Air while Ambulating = 82%  Patient Saturations on 3 Liters of oxygen while Ambulating = 96%  Please briefly explain why patient needs home oxygen:

## 2018-06-18 NOTE — Progress Notes (Signed)
Educated pt on home Oxygen and how to use equipment. When to call the doctor. Reviewed AVS with patient. Patiens verbalized understanding

## 2018-06-18 NOTE — TOC Initial Note (Addendum)
Transition of Care [facility]) - Initial/Assessment Note    Patient Details  Name: Adrian Mclean MRN: [id number] Date of Birth: 03/01/1961  Transition of Care [facility]) CM/SW Contact:    Durenda Guthrie, RN Phone Number: 06/18/2018, 3:46 PM  Clinical Narrative:    58 yr old gentleman who was transferred to [facility], COVID 19 positive. Patient has improved and will discharge home. Patient will need oxygen for home use at discharge. CM spoke with [facility] Annice Pih, who will get oxygen tank from [employer] closet on main floor. CM notified charge nurse that sats will need to be documented for oxygen to be delivered to patient's home. CM spoke with Nida Boatman, [employer] liaison. Patient has no further Home Health needs, will discharge home with family. Blessed!  Expected Discharge Plan: Home/Self Care Barriers to Discharge: No Barriers Identified   Patient Goals and CMS Choice Patient states their goals for this hospitalization and ongoing recovery are:: get better   Choice offered to / list presented to : NA  Expected Discharge Plan and Services Expected Discharge Plan: Home/Self Care In-house Referral: NA Discharge Planning Services: CM Consult Post Acute Care Choice: NA Living arrangements for the past 2 months: Single Family Home Expected Discharge Date: 06/18/18               DME Arranged: Oxygen DME Agency: [employer] HH Arranged: NA HH Agency: NA  Prior Living Arrangements/Services Living arrangements for the past 2 months: Single Family Home Lives with:: Spouse Patient language and need for interpreter reviewed:: Yes Do you feel safe going back to the place where you live?: Yes      Need for Family Participation in Patient Care: No (Comment) Care giver support system in place?: No (comment)   Criminal Activity/Legal Involvement Pertinent to Current Situation/Hospitalization: No - Comment as needed  Activities of Daily Living Home Assistive Devices/Equipment: None ADL Screening  (condition at time of admission) Patient's cognitive ability adequate to safely complete daily activities?: Yes Is the patient deaf or have difficulty hearing?: No Does the patient have difficulty seeing, even when wearing glasses/contacts?: No Does the patient have difficulty concentrating, remembering, or making decisions?: No Patient able to express need for assistance with ADLs?: Yes Does the patient have difficulty dressing or bathing?: No Independently performs ADLs?: Yes (appropriate for developmental age) Does the patient have difficulty walking or climbing stairs?: No Weakness of Legs: None Weakness of Arms/Hands: None  Permission Sought/Granted                  Emotional Assessment   Attitude/Demeanor/Rapport: Engaged Affect (typically observed): Accepting, Appropriate Orientation: : Oriented to Self, Oriented to Place, Oriented to  Time, Oriented to Situation   Psych Involvement: No (comment)  Admission diagnosis:  COVID-19 virus detected [U07.1] Hypoxia [R09.02] Shortness of breath [R06.02] Patient Active Problem List   Diagnosis Date Noted  . Pressure injury of skin 06/12/2018  . Diabetes mellitus type II, non insulin dependent (HCC) 06/07/2018  . Acute respiratory failure with hypoxia (HCC) 06/07/2018  . ARDS (adult respiratory distress syndrome) (HCC)   . COVID-19 virus detected   . Acute respiratory distress syndrome (ARDS) due to COVID-19 virus   . Acute respiratory disease due to COVID-19 virus 06/06/2018   PCP:  Jerrilyn [facility]:   [facility] [address], [place] - [address] [place] [address] Phone: [phone] Fax: [phone]     Social Determinants of Health (SDOH) Interventions  Readmission Risk Interventions Readmission Risk Prevention Plan 06/16/2018  Transportation Screening Complete  Home Care Screening Complete  Medication Review (RN CM) Complete

## 2018-06-19 LAB — GLUCOSE, CAPILLARY
Glucose-Capillary: 166 mg/dL — ABNORMAL HIGH (ref 70–99)
Glucose-Capillary: 150 mg/dL — ABNORMAL HIGH (ref 70–99)

## 2018-06-27 ENCOUNTER — Encounter (INDEPENDENT_AMBULATORY_CARE_PROVIDER_SITE_OTHER): Payer: Self-pay

## 2018-06-27 ENCOUNTER — Telehealth (INDEPENDENT_AMBULATORY_CARE_PROVIDER_SITE_OTHER): Payer: Self-pay

## 2018-06-27 NOTE — Telephone Encounter (Signed)
Called and spoke to pt about MyChart COVID19 Home Monitoring. Pt was looking in [place] thru [facility]. I have texted link for mychart app and explained process to him. I will f/u in a couple of days to check on him. Pt notes he is feeling much better.

## 2018-06-29 ENCOUNTER — Encounter (INDEPENDENT_AMBULATORY_CARE_PROVIDER_SITE_OTHER): Payer: Self-pay

## 2018-06-30 ENCOUNTER — Encounter (INDEPENDENT_AMBULATORY_CARE_PROVIDER_SITE_OTHER): Payer: Self-pay

## 2019-11-25 IMAGING — DX PORTABLE CHEST - 1 VIEW
1 series · 1 of 1 positions shown · non-contrast
Comparison: None.

CLINICAL DATA: Body aches 1 week which shortness-of-breath.

EXAM:
PORTABLE CHEST 1 VIEW

[chest pa]
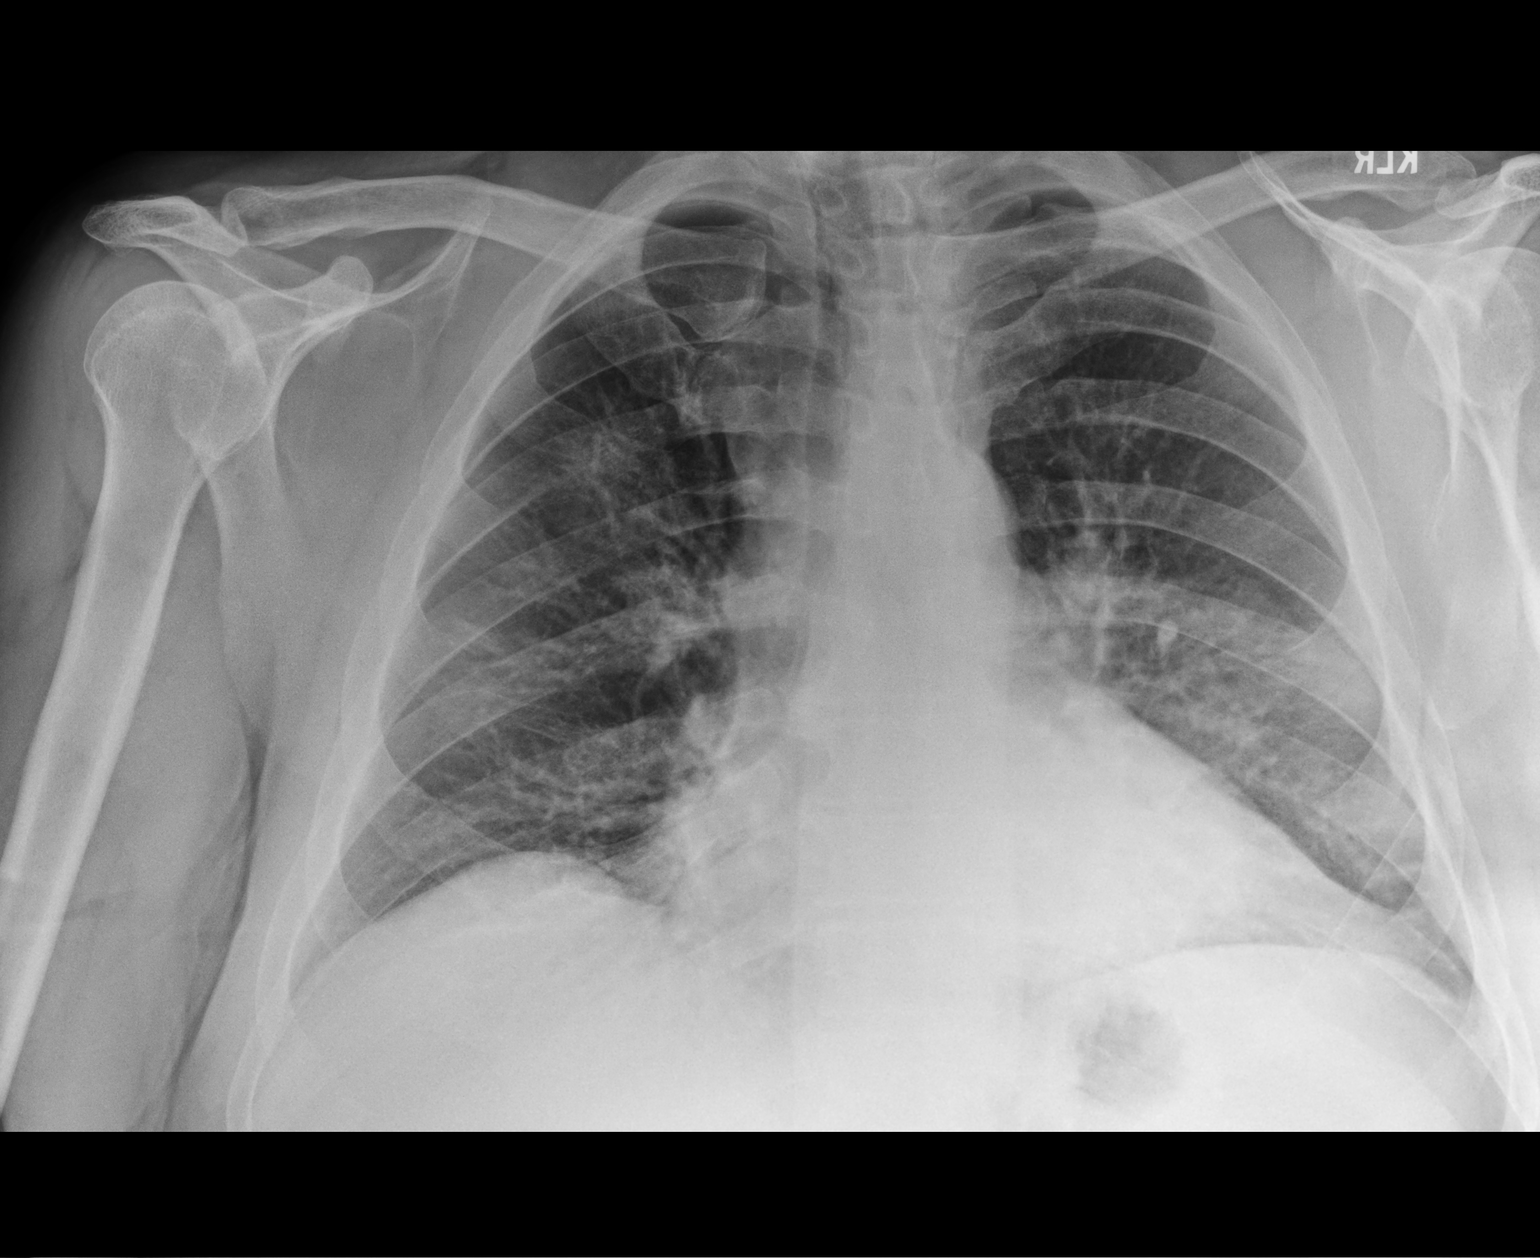

[1 of 1 positions shown; findings below may reference images not displayed]

FINDINGS: Lungs are hypoinflated demonstrate subtle patchy hazy airspace
density bilaterally. No evidence of effusion or pneumothorax.
Borderline cardiomegaly. Bones and soft tissues are normal.
IMPRESSION: Subtle hazy patchy bilateral airspace process likely due to
infection.

## 2019-12-03 IMAGING — DX PORTABLE CHEST - 1 VIEW
1 series · 1 of 1 positions shown · non-contrast
Comparison: 06/09/2018 and earlier.

CLINICAL DATA: Ventilator dependent respiratory failure. Follow-up
pneumonia.

EXAM:
PORTABLE CHEST 1 VIEW

[chest]
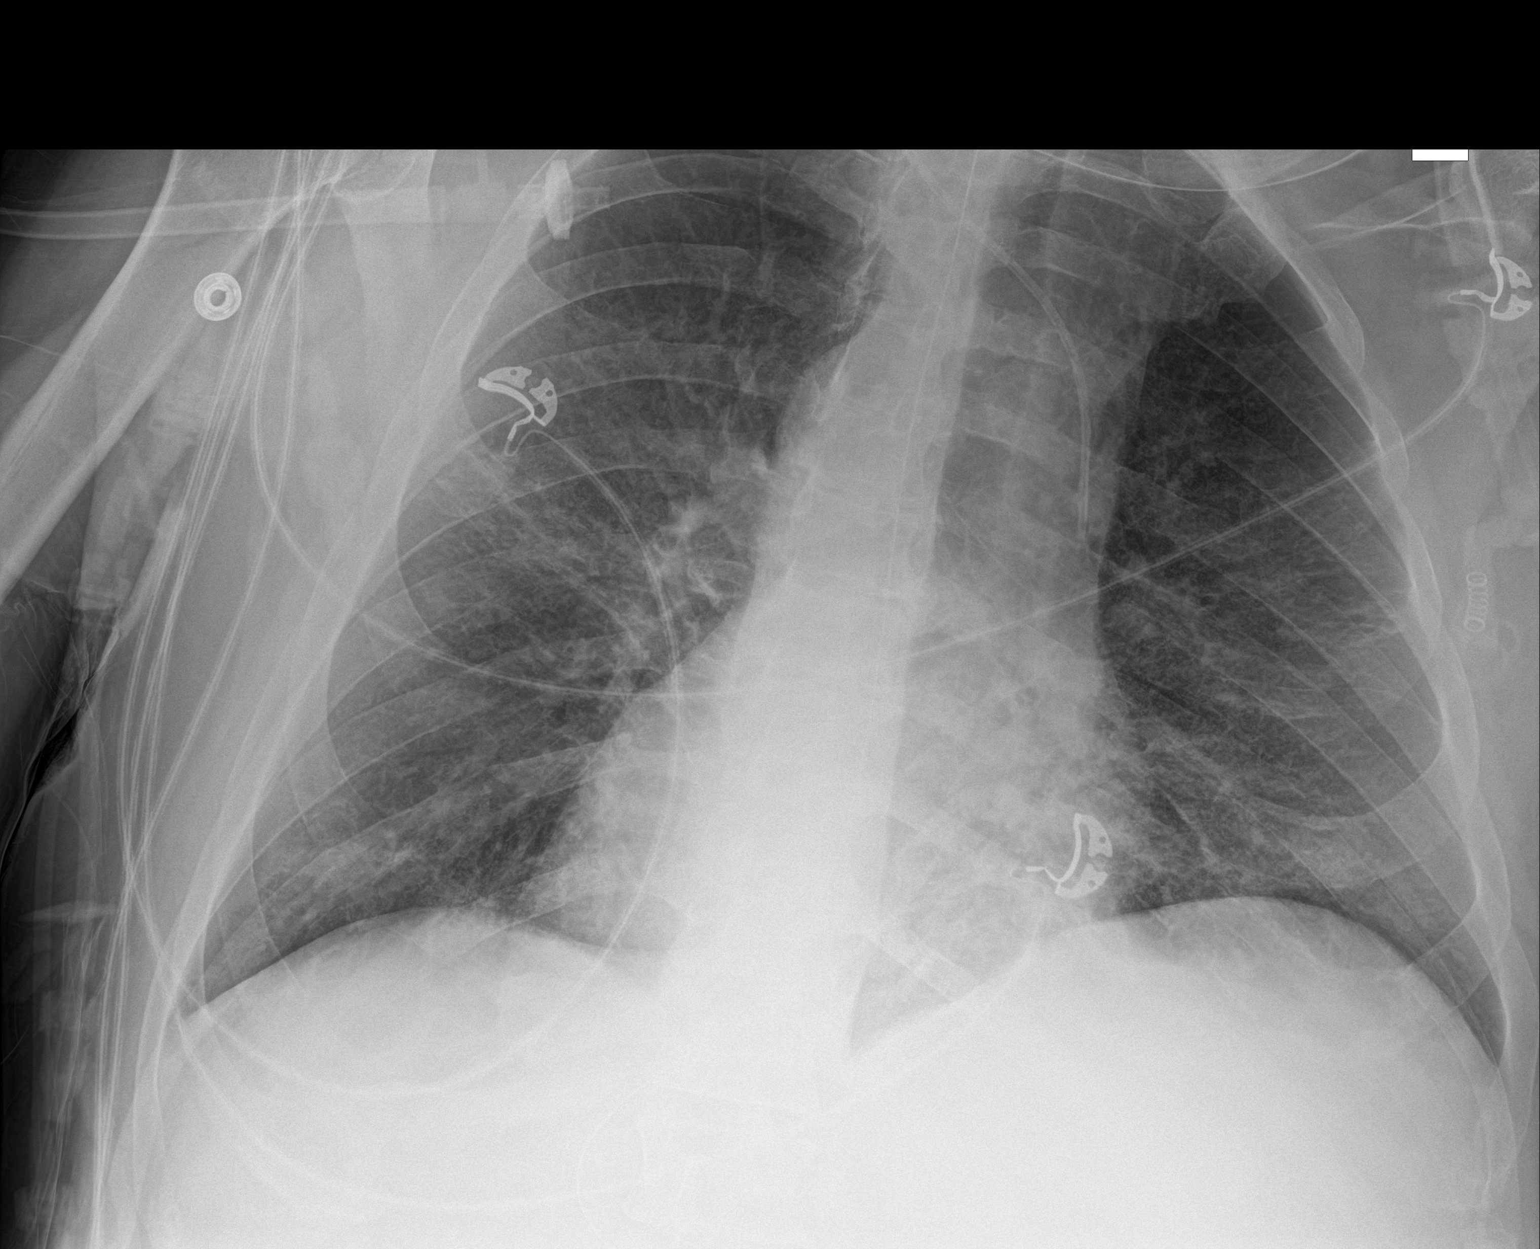

[1 of 1 positions shown; findings below may reference images not displayed]

FINDINGS: Endotracheal tube tip just below the thoracic inlet, approximately
13 cm above the carina. LEFT subclavian central venous catheter tip
projects over the mid SVC, unchanged. Improved aeration in both
lungs since yesterday, with only mild hazy and patchy airspace
opacities persisting. No new abnormalities.
IMPRESSION: 1. Endotracheal tube tip just below the thoracic inlet approximately
13 cm above the carina. This could be advanced 3-4 cm. Remaining
support apparatus satisfactory.
2. Improved aeration in both lungs since yesterday, with only mild
hazy and patchy airspace opacities persisting.
3. No new abnormalities.

These results will be called to the ordering clinician or
representative by the Radiologist Assistant, and communication
documented in the PACS or zVision Dashboard.
